# Patient Record
Sex: Female | Born: 1970 | Race: Black or African American | Hispanic: No | Marital: Single | Smoking: Never smoker
Health system: Southern US, Community
[De-identification: ages and names within clinical notes are randomized; demographics above are authoritative.]

## PROBLEM LIST (undated history)

## (undated) DIAGNOSIS — H269 Unspecified cataract: Secondary | ICD-10-CM

## (undated) DIAGNOSIS — I10 Essential (primary) hypertension: Secondary | ICD-10-CM

## (undated) DIAGNOSIS — K219 Gastro-esophageal reflux disease without esophagitis: Secondary | ICD-10-CM

## (undated) DIAGNOSIS — D649 Anemia, unspecified: Secondary | ICD-10-CM

## (undated) DIAGNOSIS — K589 Irritable bowel syndrome without diarrhea: Secondary | ICD-10-CM

## (undated) DIAGNOSIS — M1990 Unspecified osteoarthritis, unspecified site: Secondary | ICD-10-CM

## (undated) DIAGNOSIS — R011 Cardiac murmur, unspecified: Secondary | ICD-10-CM

## (undated) HISTORY — DX: Irritable bowel syndrome, unspecified: K58.9

## (undated) HISTORY — PX: WISDOM TOOTH EXTRACTION: SHX21

## (undated) HISTORY — DX: Unspecified osteoarthritis, unspecified site: M19.90

## (undated) HISTORY — DX: Unspecified cataract: H26.9

## (undated) HISTORY — PX: OTHER SURGICAL HISTORY: SHX169

## (undated) HISTORY — DX: Gastro-esophageal reflux disease without esophagitis: K21.9

---

## 2000-03-02 ENCOUNTER — Encounter (INDEPENDENT_AMBULATORY_CARE_PROVIDER_SITE_OTHER): Payer: Self-pay

## 2000-03-02 ENCOUNTER — Encounter: Payer: Self-pay | Admitting: Obstetrics

## 2000-03-02 ENCOUNTER — Inpatient Hospital Stay (HOSPITAL_COMMUNITY): Admission: AD | Admit: 2000-03-02 | Discharge: 2000-03-02 | Payer: Self-pay | Admitting: Obstetrics

## 2001-04-27 ENCOUNTER — Inpatient Hospital Stay (HOSPITAL_COMMUNITY): Admission: AD | Admit: 2001-04-27 | Discharge: 2001-04-27 | Payer: Self-pay | Admitting: Obstetrics

## 2001-05-04 ENCOUNTER — Encounter (HOSPITAL_COMMUNITY): Admission: RE | Admit: 2001-05-04 | Discharge: 2001-05-07 | Payer: Self-pay | Admitting: Obstetrics

## 2001-05-08 ENCOUNTER — Inpatient Hospital Stay (HOSPITAL_COMMUNITY): Admission: AD | Admit: 2001-05-08 | Discharge: 2001-05-10 | Payer: Self-pay | Admitting: Obstetrics

## 2001-06-05 HISTORY — PX: TUBAL LIGATION: SHX77

## 2001-06-21 ENCOUNTER — Ambulatory Visit (HOSPITAL_COMMUNITY): Admission: RE | Admit: 2001-06-21 | Discharge: 2001-06-21 | Payer: Self-pay | Admitting: Obstetrics

## 2002-07-04 ENCOUNTER — Emergency Department (HOSPITAL_COMMUNITY): Admission: AC | Admit: 2002-07-04 | Discharge: 2002-07-04 | Payer: Self-pay

## 2002-07-04 ENCOUNTER — Encounter: Payer: Self-pay | Admitting: Emergency Medicine

## 2004-02-26 ENCOUNTER — Other Ambulatory Visit: Admission: RE | Admit: 2004-02-26 | Discharge: 2004-02-26 | Payer: Self-pay | Admitting: Internal Medicine

## 2005-09-09 ENCOUNTER — Ambulatory Visit: Payer: Self-pay | Admitting: Internal Medicine

## 2006-07-25 ENCOUNTER — Ambulatory Visit: Payer: Self-pay | Admitting: Internal Medicine

## 2006-07-25 LAB — CONVERTED CEMR LAB
ALT: 16 units/L (ref 0–40)
AST: 24 units/L (ref 0–37)
Albumin: 3.7 g/dL (ref 3.5–5.2)
Alkaline Phosphatase: 77 units/L (ref 39–117)
BUN: 9 mg/dL (ref 6–23)
Basophils Absolute: 0 10*3/uL (ref 0.0–0.1)
Basophils Relative: 0.1 % (ref 0.0–1.0)
CO2: 26 meq/L (ref 19–32)
Calcium: 9 mg/dL (ref 8.4–10.5)
Chloride: 107 meq/L (ref 96–112)
Chol/HDL Ratio, serum: 2.5
Cholesterol: 131 mg/dL (ref 0–200)
Creatinine, Ser: 0.9 mg/dL (ref 0.4–1.2)
Eosinophil percent: 3.2 % (ref 0.0–5.0)
GFR calc non Af Amer: 77 mL/min
Glomerular Filtration Rate, Af Am: 93 mL/min/{1.73_m2}
Glucose, Bld: 86 mg/dL (ref 70–99)
HCT: 38.1 % (ref 36.0–46.0)
HDL: 52.3 mg/dL (ref >39.0)
Hemoglobin: 12.5 g/dL (ref 12.0–15.0)
LDL Cholesterol: 69 mg/dL (ref 0–99)
Lymphocytes Relative: 45.2 % (ref 12.0–46.0)
MCHC: 32.9 g/dL (ref 30.0–36.0)
MCV: 82.6 fL (ref 78.0–100.0)
Monocytes Absolute: 0.2 10*3/uL (ref 0.2–0.7)
Monocytes Relative: 7.7 % (ref 3.0–11.0)
Neutro Abs: 1.4 10*3/uL (ref 1.4–7.7)
Neutrophils Relative %: 43.8 % (ref 43.0–77.0)
Platelets: 199 10*3/uL (ref 150–400)
Potassium: 3.6 meq/L (ref 3.5–5.1)
RBC: 4.61 M/uL (ref 3.87–5.11)
RDW: 12.6 % (ref 11.5–14.6)
Sodium: 139 meq/L (ref 135–145)
TSH: 2.74 microintl units/mL (ref 0.35–5.50)
Total Bilirubin: 0.8 mg/dL (ref 0.3–1.2)
Total Protein: 7.1 g/dL (ref 6.0–8.3)
Triglyceride fasting, serum: 48 mg/dL (ref 0–149)
VLDL: 10 mg/dL (ref 0–40)
WBC: 3.1 10*3/uL — ABNORMAL LOW (ref 4.5–10.5)

## 2007-11-09 ENCOUNTER — Ambulatory Visit: Payer: Self-pay | Admitting: Internal Medicine

## 2007-11-09 DIAGNOSIS — N760 Acute vaginitis: Secondary | ICD-10-CM | POA: Insufficient documentation

## 2007-11-09 DIAGNOSIS — R519 Headache, unspecified: Secondary | ICD-10-CM | POA: Insufficient documentation

## 2007-11-09 DIAGNOSIS — R51 Headache: Secondary | ICD-10-CM | POA: Insufficient documentation

## 2007-11-09 DIAGNOSIS — I10 Essential (primary) hypertension: Secondary | ICD-10-CM | POA: Insufficient documentation

## 2007-11-09 LAB — CONVERTED CEMR LAB: Chlamydia, DNA Probe: NEGATIVE

## 2010-08-06 ENCOUNTER — Telehealth: Payer: Self-pay | Admitting: Internal Medicine

## 2010-09-03 ENCOUNTER — Ambulatory Visit: Payer: Self-pay | Admitting: Internal Medicine

## 2010-09-03 LAB — CONVERTED CEMR LAB
ALT: 16 units/L (ref 0–35)
AST: 26 units/L (ref 0–37)
Albumin: 3.5 g/dL (ref 3.5–5.2)
Alkaline Phosphatase: 67 units/L (ref 39–117)
BUN: 14 mg/dL (ref 6–23)
Basophils Absolute: 0 10*3/uL (ref 0.0–0.1)
Basophils Relative: 0.2 % (ref 0.0–3.0)
Bilirubin Urine: NEGATIVE
Bilirubin, Direct: 0 mg/dL (ref 0.0–0.3)
CO2: 26 meq/L (ref 19–32)
Calcium: 9.1 mg/dL (ref 8.4–10.5)
Chloride: 109 meq/L (ref 96–112)
Cholesterol: 136 mg/dL (ref 0–200)
Creatinine, Ser: 0.9 mg/dL (ref 0.4–1.2)
Eosinophils Absolute: 0.1 10*3/uL (ref 0.0–0.7)
Eosinophils Relative: 4.6 % (ref 0.0–5.0)
GFR calc non Af Amer: 96.4 mL/min (ref >60.00)
Glucose, Bld: 78 mg/dL (ref 70–99)
Glucose, Urine, Semiquant: NEGATIVE
HCT: 28.1 % — ABNORMAL LOW (ref 36.0–46.0)
HDL: 52.7 mg/dL (ref >39.00)
Hemoglobin: 8.9 g/dL — ABNORMAL LOW (ref 12.0–15.0)
Ketones, urine, test strip: NEGATIVE
LDL Cholesterol: 79 mg/dL (ref 0–99)
Lymphocytes Relative: 39.8 % (ref 12.0–46.0)
Lymphs Abs: 1.1 10*3/uL (ref 0.7–4.0)
MCHC: 31.5 g/dL (ref 30.0–36.0)
MCV: 67.2 fL — ABNORMAL LOW (ref 78.0–100.0)
Monocytes Absolute: 0.2 10*3/uL (ref 0.1–1.0)
Monocytes Relative: 6.4 % (ref 3.0–12.0)
Neutro Abs: 1.3 10*3/uL — ABNORMAL LOW (ref 1.4–7.7)
Neutrophils Relative %: 49 % (ref 43.0–77.0)
Nitrite: NEGATIVE
Platelets: 246 10*3/uL (ref 150.0–400.0)
Potassium: 4.8 meq/L (ref 3.5–5.1)
RBC: 4.19 M/uL (ref 3.87–5.11)
RDW: 20.4 % — ABNORMAL HIGH (ref 11.5–14.6)
Sodium: 139 meq/L (ref 135–145)
Specific Gravity, Urine: >=1.03
TSH: 3.66 microintl units/mL (ref 0.35–5.50)
Total Bilirubin: 0.5 mg/dL (ref 0.3–1.2)
Total CHOL/HDL Ratio: 3
Total Protein: 6.9 g/dL (ref 6.0–8.3)
Triglycerides: 23 mg/dL (ref 0.0–149.0)
Urobilinogen, UA: 0.2
VLDL: 4.6 mg/dL (ref 0.0–40.0)
WBC Urine, dipstick: NEGATIVE
WBC: 2.7 10*3/uL — ABNORMAL LOW (ref 4.5–10.5)
pH: 5

## 2010-09-17 ENCOUNTER — Ambulatory Visit: Admit: 2010-09-17 | Payer: Self-pay | Admitting: Internal Medicine

## 2010-09-26 ENCOUNTER — Encounter: Payer: Self-pay | Admitting: Obstetrics

## 2010-10-07 NOTE — Progress Notes (Signed)
Summary: Pt req refill of Norvasc 5mg  to [facility] [address]  Phone Note Refill Request Call back at [phone] Message from:  Patient on August 06, 2010 3:55 PM  Refills Requested: Medication #1:  NORVASC 5 MG  TABS once daily   Dosage confirmed as above?Dosage Confirmed Pls call in to [facility] on [address]    Method Requested: Telephone to Pharmacy Initial call taken by: Lucy Antigua,  August 06, 2010 3:55 PM    New/Updated Medications: NORVASC 5 MG  TABS (AMLODIPINE BESYLATE) once daily-no more with ov Prescriptions: NORVASC 5 MG  TABS (AMLODIPINE BESYLATE) once daily-no more with ov  #15 x 0   Entered by:   Willy Eddy, LPN   Authorized by:   Stacie Glaze MD   Signed by:   Willy Eddy, LPN on 56/43/3295   Method used:   Electronically to        [facility] [id number] (retail)       [address]       [place], [place]  [address]       Ph: [phone]       Fax: [phone]   RxID:   [id number]

## 2011-01-21 NOTE — Op Note (Signed)
[facility]  Patient:    Michele Gilmore, Michele Gilmore Visit Number: [id number] MRN: [id number]          Service Type: DSU Location: [facility] Attending Physician:  Venita Sheffield Dictated by:   Kathreen Cosier, M.D. Admit Date:  06/21/2001                             Operative Report  PREOPERATIVE DIAGNOSES:       Multiparity.  PROCEDURE:                    Open laparoscopic tubal.  DESCRIPTION OF PROCEDURE:     Under general anesthesia with the patient in a lithotomy position, abdomen, perineum, and vagina were prepped and draped. Bladder emptied with straight catheter.  Hulka tenaculum placed in the cervix. An umbilical transverse incision was made, carried down to the fascia.  Fascia cleaned and grasped with two Kochers.  Fascia and perineum opened with Mayo scissors.  Sleeve and trocar inserted intraperitoneally.  Carbon dioxide 3 L infused intraperitoneally.  Visualizing scope inserted through the sleeve of the trocar.  Uterus, tubes, and ovaries were normal.  Cautery prove was inserted through the sleeve and scope.  The right tube was grasped 1 inch from the cornua and the tube was traced to the fimbria.  Starting 1 inch from the cornua the tube was cauterized a total of five places moving lateral from the first site of cautery.  Procedure was done in exact fashion.  Lap and sponge counts correct.  Abdomen closed in layers.  Peritoneum continuous suture of 0 Chromic.  Skin closed with subcuticular stitch of 3-0 plain.  Patient tolerated procedure well, taken to recovery room in good condition. Dictated by:   Kathreen Cosier, M.D. Attending Physician:  Venita Sheffield DD:  06/21/01 TD:  06/21/01 Job: [id number]

## 2011-03-14 ENCOUNTER — Other Ambulatory Visit: Payer: Self-pay | Admitting: Obstetrics & Gynecology

## 2011-03-14 DIAGNOSIS — D219 Benign neoplasm of connective and other soft tissue, unspecified: Secondary | ICD-10-CM

## 2011-03-17 ENCOUNTER — Ambulatory Visit (HOSPITAL_COMMUNITY): Payer: Self-pay

## 2011-03-18 ENCOUNTER — Ambulatory Visit (HOSPITAL_COMMUNITY)
Admission: RE | Admit: 2011-03-18 | Discharge: 2011-03-18 | Disposition: A | Discharge status: Home / Self Care | Payer: 59 | Source: Ambulatory Visit | Attending: Obstetrics & Gynecology | Admitting: Obstetrics & Gynecology

## 2011-03-18 DIAGNOSIS — D259 Leiomyoma of uterus, unspecified: Secondary | ICD-10-CM | POA: Insufficient documentation

## 2011-03-18 DIAGNOSIS — D219 Benign neoplasm of connective and other soft tissue, unspecified: Secondary | ICD-10-CM

## 2011-09-06 HISTORY — PX: EYE SURGERY: SHX253

## 2011-10-12 ENCOUNTER — Other Ambulatory Visit: Payer: Self-pay | Admitting: Obstetrics

## 2011-10-15 ENCOUNTER — Encounter (HOSPITAL_COMMUNITY): Payer: Self-pay

## 2011-10-18 ENCOUNTER — Encounter (HOSPITAL_COMMUNITY): Payer: Self-pay

## 2011-10-18 ENCOUNTER — Encounter (HOSPITAL_COMMUNITY)
Admission: RE | Admit: 2011-10-18 | Discharge: 2011-10-18 | Disposition: A | Discharge status: Home / Self Care | Payer: 59 | Source: Ambulatory Visit | Attending: Obstetrics | Admitting: Obstetrics

## 2011-10-18 HISTORY — DX: Cardiac murmur, unspecified: R01.1

## 2011-10-18 HISTORY — DX: Essential (primary) hypertension: I10

## 2011-10-18 HISTORY — DX: Anemia, unspecified: D64.9

## 2011-10-18 LAB — SURGICAL PCR SCREEN
MRSA, PCR: NEGATIVE
Staphylococcus aureus: NEGATIVE

## 2011-10-18 LAB — BASIC METABOLIC PANEL
BUN: 10 mg/dL (ref 6–23)
Calcium: 9.2 mg/dL (ref 8.4–10.5)
Chloride: 104 mEq/L (ref 96–112)
Creatinine, Ser: 0.89 mg/dL (ref 0.50–1.10)
GFR calc Af Amer: >90 mL/min (ref >90)
GFR calc non Af Amer: 81 mL/min — ABNORMAL LOW (ref >90)

## 2011-10-18 LAB — CBC
HCT: 33.5 % — ABNORMAL LOW (ref 36.0–46.0)
MCHC: 31.6 g/dL (ref 30.0–36.0)
Platelets: 221 10*3/uL (ref 150–400)
RDW: 16.5 % — ABNORMAL HIGH (ref 11.5–15.5)
WBC: 2.7 10*3/uL — ABNORMAL LOW (ref 4.0–10.5)

## 2011-10-18 NOTE — Patient Instructions (Addendum)
   Your procedure is scheduled on: Friday, Feb 15th  Enter through the Main Entrance of [facility] at: 730am Pick up the phone at the desk and dial [phone] and inform us of your arrival.  Please call this number if you have any problems the morning of surgery: [phone]  Remember: Do not eat food after midnight: Thursday Do not drink clear liquids after: Thursday Take these medicines the morning of surgery with a SIP OF WATER: None.  (Patient takes BP med in the evenings.)  Do not wear jewelry, make-up, or FINGER nail polish Do not wear lotions, powders, perfumes or deodorant. Do not shave 48 hours prior to surgery. Do not bring valuables to the hospital.  Leave suitcase in the car. After Surgery it may be brought to your room. For patients being admitted to the hospital, checkout time is 11:00am the day of discharge.  Home with  Mother [name]  cell [phone]  Patients discharged on the day of surgery will not be allowed to drive home.     Remember to use your hibiclens as instructed.Please shower with 1/2 bottle the evening before your surgery and the other 1/2 bottle the morning of surgery.

## 2011-10-18 NOTE — Pre-Procedure Instructions (Signed)
Ok to see patient DOS. 

## 2011-10-20 MED ORDER — CEFAZOLIN SODIUM-DEXTROSE 2-3 GM-% IV SOLR
2.0000 g | INTRAVENOUS | Status: AC
  Administered 2011-10-21: 2 g via INTRAVENOUS
  Filled 2011-10-20: qty 50

## 2011-10-21 ENCOUNTER — Encounter (HOSPITAL_COMMUNITY)
Admission: RE | Disposition: A | Discharge status: Home / Self Care | Payer: Self-pay | Source: Ambulatory Visit | Attending: Obstetrics

## 2011-10-21 ENCOUNTER — Encounter (HOSPITAL_COMMUNITY): Payer: Self-pay | Admitting: Anesthesiology

## 2011-10-21 ENCOUNTER — Other Ambulatory Visit: Payer: Self-pay | Admitting: Obstetrics

## 2011-10-21 ENCOUNTER — Inpatient Hospital Stay (HOSPITAL_COMMUNITY): Payer: 59 | Admitting: Anesthesiology

## 2011-10-21 ENCOUNTER — Encounter (HOSPITAL_COMMUNITY): Payer: Self-pay | Admitting: *Deleted

## 2011-10-21 ENCOUNTER — Inpatient Hospital Stay (HOSPITAL_COMMUNITY)
Admission: RE | Admit: 2011-10-21 | Discharge: 2011-10-23 | DRG: 743 | Disposition: A | Discharge status: Home / Self Care | Payer: 59 | Source: Ambulatory Visit | Attending: Obstetrics | Admitting: Obstetrics

## 2011-10-21 DIAGNOSIS — N838 Other noninflammatory disorders of ovary, fallopian tube and broad ligament: Secondary | ICD-10-CM | POA: Diagnosis present

## 2011-10-21 DIAGNOSIS — N760 Acute vaginitis: Secondary | ICD-10-CM

## 2011-10-21 DIAGNOSIS — R51 Headache: Secondary | ICD-10-CM

## 2011-10-21 DIAGNOSIS — N949 Unspecified condition associated with female genital organs and menstrual cycle: Secondary | ICD-10-CM | POA: Diagnosis present

## 2011-10-21 DIAGNOSIS — Z90710 Acquired absence of both cervix and uterus: Secondary | ICD-10-CM

## 2011-10-21 DIAGNOSIS — I10 Essential (primary) hypertension: Secondary | ICD-10-CM

## 2011-10-21 DIAGNOSIS — N938 Other specified abnormal uterine and vaginal bleeding: Secondary | ICD-10-CM | POA: Diagnosis present

## 2011-10-21 DIAGNOSIS — D251 Intramural leiomyoma of uterus: Principal | ICD-10-CM | POA: Diagnosis present

## 2011-10-21 DIAGNOSIS — D252 Subserosal leiomyoma of uterus: Secondary | ICD-10-CM | POA: Diagnosis present

## 2011-10-21 HISTORY — PX: ABDOMINAL HYSTERECTOMY: SHX81

## 2011-10-21 LAB — BASIC METABOLIC PANEL
Calcium: 8.6 mg/dL (ref 8.4–10.5)
GFR calc non Af Amer: 79 mL/min — ABNORMAL LOW (ref >90)
Glucose, Bld: 101 mg/dL — ABNORMAL HIGH (ref 70–99)
Sodium: 138 mEq/L (ref 135–145)

## 2011-10-21 LAB — PREGNANCY, URINE: Preg Test, Ur: NEGATIVE

## 2011-10-21 SURGERY — HYSTERECTOMY, ABDOMINAL
Anesthesia: General | Site: Abdomen | Wound class: Clean Contaminated

## 2011-10-21 MED ORDER — DIPHENHYDRAMINE HCL 12.5 MG/5ML PO ELIX: 12.5000 mg | ORAL_SOLUTION | Freq: Four times a day (QID) | ORAL | Status: DC | PRN

## 2011-10-21 MED ORDER — KETOROLAC TROMETHAMINE 30 MG/ML IJ SOLN
INTRAMUSCULAR | Status: DC | PRN
  Administered 2011-10-21: 30 mg via INTRAVENOUS

## 2011-10-21 MED ORDER — GLYCOPYRROLATE 0.2 MG/ML IJ SOLN
INTRAMUSCULAR | Status: AC
  Filled 2011-10-21: qty 1

## 2011-10-21 MED ORDER — IBUPROFEN 800 MG PO TABS: 800.0000 mg | ORAL_TABLET | Freq: Three times a day (TID) | ORAL | Status: DC | PRN

## 2011-10-21 MED ORDER — KETOROLAC TROMETHAMINE 30 MG/ML IJ SOLN
INTRAMUSCULAR | Status: AC
  Filled 2011-10-21: qty 1

## 2011-10-21 MED ORDER — HYDROMORPHONE HCL PF 1 MG/ML IJ SOLN
0.2500 mg | INTRAMUSCULAR | Status: DC | PRN
  Administered 2011-10-21 (×2): 0.5 mg via INTRAVENOUS

## 2011-10-21 MED ORDER — SODIUM CHLORIDE 0.9 % IJ SOLN: 9.0000 mL | INTRAMUSCULAR | Status: DC | PRN

## 2011-10-21 MED ORDER — ONDANSETRON HCL 4 MG/2ML IJ SOLN: 4.0000 mg | Freq: Four times a day (QID) | INTRAMUSCULAR | Status: DC | PRN

## 2011-10-21 MED ORDER — FENTANYL CITRATE 0.05 MG/ML IJ SOLN
INTRAMUSCULAR | Status: DC | PRN
  Administered 2011-10-21: 100 ug via INTRAVENOUS
  Administered 2011-10-21: 150 ug via INTRAVENOUS

## 2011-10-21 MED ORDER — LACTATED RINGERS IV SOLN
INTRAVENOUS | Status: DC
  Administered 2011-10-21 (×4): via INTRAVENOUS

## 2011-10-21 MED ORDER — 0.9 % SODIUM CHLORIDE (POUR BTL) OPTIME
TOPICAL | Status: DC | PRN
  Administered 2011-10-21: 3000 mL

## 2011-10-21 MED ORDER — ONDANSETRON HCL 4 MG/2ML IJ SOLN
INTRAMUSCULAR | Status: AC
  Filled 2011-10-21: qty 2

## 2011-10-21 MED ORDER — HYDROMORPHONE 0.3 MG/ML IV SOLN
INTRAVENOUS | Status: AC
  Filled 2011-10-21: qty 25

## 2011-10-21 MED ORDER — DEXTROSE 5 % IV SOLN
1.0000 g | Freq: Four times a day (QID) | INTRAVENOUS | Status: DC
  Filled 2011-10-21 (×2): qty 1

## 2011-10-21 MED ORDER — DEXTROSE 5 % IV SOLN
2.0000 g | Freq: Four times a day (QID) | INTRAVENOUS | Status: AC
  Administered 2011-10-21 (×2): 2 g via INTRAVENOUS
  Filled 2011-10-21 (×2): qty 2

## 2011-10-21 MED ORDER — NEOSTIGMINE METHYLSULFATE 1 MG/ML IJ SOLN
INTRAMUSCULAR | Status: DC | PRN
  Administered 2011-10-21: 3 mg via INTRAVENOUS

## 2011-10-21 MED ORDER — NALOXONE HCL 0.4 MG/ML IJ SOLN: 0.4000 mg | INTRAMUSCULAR | Status: DC | PRN

## 2011-10-21 MED ORDER — ROCURONIUM BROMIDE 50 MG/5ML IV SOLN
INTRAVENOUS | Status: AC
  Filled 2011-10-21: qty 1

## 2011-10-21 MED ORDER — OXYCODONE-ACETAMINOPHEN 5-325 MG PO TABS
1.0000 | ORAL_TABLET | ORAL | Status: DC | PRN
  Administered 2011-10-22 – 2011-10-23 (×4): 2 via ORAL
  Filled 2011-10-21 (×4): qty 2

## 2011-10-21 MED ORDER — DEXAMETHASONE SODIUM PHOSPHATE 10 MG/ML IJ SOLN
INTRAMUSCULAR | Status: AC
  Filled 2011-10-21: qty 1

## 2011-10-21 MED ORDER — NEOSTIGMINE METHYLSULFATE 1 MG/ML IJ SOLN
INTRAMUSCULAR | Status: AC
  Filled 2011-10-21: qty 10

## 2011-10-21 MED ORDER — MIDAZOLAM HCL 5 MG/5ML IJ SOLN
INTRAMUSCULAR | Status: DC | PRN
  Administered 2011-10-21: 2 mg via INTRAVENOUS

## 2011-10-21 MED ORDER — DIPHENHYDRAMINE HCL 50 MG/ML IJ SOLN: 12.5000 mg | Freq: Four times a day (QID) | INTRAMUSCULAR | Status: DC | PRN

## 2011-10-21 MED ORDER — PROPOFOL 10 MG/ML IV EMUL
INTRAVENOUS | Status: AC
  Filled 2011-10-21: qty 20

## 2011-10-21 MED ORDER — FENTANYL CITRATE 0.05 MG/ML IJ SOLN
INTRAMUSCULAR | Status: AC
  Filled 2011-10-21: qty 5

## 2011-10-21 MED ORDER — KETOROLAC TROMETHAMINE 30 MG/ML IJ SOLN: 30.0000 mg | Freq: Four times a day (QID) | INTRAMUSCULAR | Status: DC

## 2011-10-21 MED ORDER — SODIUM CHLORIDE 0.9 % IJ SOLN
9.0000 mL | INTRAMUSCULAR | Status: DC | PRN
Start: 2011-10-21 — End: 2011-10-22

## 2011-10-21 MED ORDER — ONDANSETRON HCL 4 MG/2ML IJ SOLN
4.0000 mg | Freq: Four times a day (QID) | INTRAMUSCULAR | Status: DC | PRN
  Administered 2011-10-22: 4 mg via INTRAVENOUS
  Filled 2011-10-21: qty 2

## 2011-10-21 MED ORDER — LACTATED RINGERS IV SOLN: INTRAVENOUS | Status: DC

## 2011-10-21 MED ORDER — DEXAMETHASONE SODIUM PHOSPHATE 4 MG/ML IJ SOLN
INTRAMUSCULAR | Status: DC | PRN
  Administered 2011-10-21: 10 mg via INTRAVENOUS

## 2011-10-21 MED ORDER — GLYCOPYRROLATE 0.2 MG/ML IJ SOLN
INTRAMUSCULAR | Status: DC | PRN
  Administered 2011-10-21: .6 mg via INTRAVENOUS

## 2011-10-21 MED ORDER — PROPOFOL 10 MG/ML IV BOLUS
INTRAVENOUS | Status: DC | PRN
  Administered 2011-10-21: 150 mg via INTRAVENOUS
  Administered 2011-10-21: 20 mg via INTRAVENOUS

## 2011-10-21 MED ORDER — ONDANSETRON HCL 4 MG PO TABS: 4.0000 mg | ORAL_TABLET | Freq: Four times a day (QID) | ORAL | Status: DC | PRN

## 2011-10-21 MED ORDER — HYDROMORPHONE 0.3 MG/ML IV SOLN: INTRAVENOUS | Status: DC

## 2011-10-21 MED ORDER — MEPERIDINE HCL 25 MG/ML IJ SOLN: 6.2500 mg | INTRAMUSCULAR | Status: DC | PRN

## 2011-10-21 MED ORDER — LIDOCAINE HCL (CARDIAC) 20 MG/ML IV SOLN
INTRAVENOUS | Status: AC
  Filled 2011-10-21: qty 5

## 2011-10-21 MED ORDER — HYDROMORPHONE 0.3 MG/ML IV SOLN
INTRAVENOUS | Status: DC
  Administered 2011-10-21: 13:00:00 via INTRAVENOUS

## 2011-10-21 MED ORDER — METOCLOPRAMIDE HCL 5 MG/ML IJ SOLN: 10.0000 mg | Freq: Once | INTRAMUSCULAR | Status: AC | PRN

## 2011-10-21 MED ORDER — LACTATED RINGERS IV SOLN
INTRAVENOUS | Status: DC
  Administered 2011-10-21 – 2011-10-22 (×2): via INTRAVENOUS

## 2011-10-21 MED ORDER — ONDANSETRON HCL 4 MG/2ML IJ SOLN
INTRAMUSCULAR | Status: DC | PRN
  Administered 2011-10-21: 4 mg via INTRAVENOUS

## 2011-10-21 MED ORDER — OXYCODONE-ACETAMINOPHEN 5-325 MG PO TABS: 1.0000 | ORAL_TABLET | ORAL | Status: DC | PRN

## 2011-10-21 MED ORDER — KETOROLAC TROMETHAMINE 30 MG/ML IJ SOLN
30.0000 mg | Freq: Four times a day (QID) | INTRAMUSCULAR | Status: AC
  Administered 2011-10-21 – 2011-10-22 (×3): 30 mg via INTRAVENOUS
  Filled 2011-10-21 (×3): qty 1

## 2011-10-21 MED ORDER — HYDROMORPHONE 0.3 MG/ML IV SOLN
INTRAVENOUS | Status: DC
  Administered 2011-10-21: 5.7 mg via INTRAVENOUS
  Administered 2011-10-21: 5 mL via INTRAVENOUS
  Administered 2011-10-21: 23:00:00 via INTRAVENOUS
  Administered 2011-10-22: 1.67 mg via INTRAVENOUS
  Administered 2011-10-22: 1.3 mg via INTRAVENOUS
  Administered 2011-10-22: 1.2 mg via INTRAVENOUS
  Administered 2011-10-22: 0.9 mg via INTRAVENOUS

## 2011-10-21 MED ORDER — FENTANYL CITRATE 0.05 MG/ML IJ SOLN
25.0000 ug | INTRAMUSCULAR | Status: DC | PRN
  Administered 2011-10-21 (×2): 50 ug via INTRAVENOUS

## 2011-10-21 MED ORDER — HYDROMORPHONE HCL PF 1 MG/ML IJ SOLN
INTRAMUSCULAR | Status: AC
  Administered 2011-10-21: 0.5 mg via INTRAVENOUS
  Filled 2011-10-21: qty 1

## 2011-10-21 MED ORDER — KETOROLAC TROMETHAMINE 30 MG/ML IJ SOLN: 30.0000 mg | Freq: Four times a day (QID) | INTRAMUSCULAR | Status: AC

## 2011-10-21 MED ORDER — HYDROMORPHONE HCL PF 1 MG/ML IJ SOLN
INTRAMUSCULAR | Status: AC
  Filled 2011-10-21: qty 1

## 2011-10-21 MED ORDER — FENTANYL CITRATE 0.05 MG/ML IJ SOLN
INTRAMUSCULAR | Status: AC
  Administered 2011-10-21: 50 ug via INTRAVENOUS
  Filled 2011-10-21: qty 2

## 2011-10-21 MED ORDER — IBUPROFEN 800 MG PO TABS
800.0000 mg | ORAL_TABLET | Freq: Three times a day (TID) | ORAL | Status: DC | PRN
  Administered 2011-10-22 – 2011-10-23 (×2): 800 mg via ORAL
  Filled 2011-10-21 (×2): qty 1

## 2011-10-21 MED ORDER — ROCURONIUM BROMIDE 100 MG/10ML IV SOLN
INTRAVENOUS | Status: DC | PRN
  Administered 2011-10-21: 5 mg via INTRAVENOUS
  Administered 2011-10-21: 45 mg via INTRAVENOUS
  Administered 2011-10-21: 10 mg via INTRAVENOUS

## 2011-10-21 MED ORDER — MIDAZOLAM HCL 2 MG/2ML IJ SOLN
INTRAMUSCULAR | Status: AC
  Filled 2011-10-21: qty 2

## 2011-10-21 SURGICAL SUPPLY — 40 items
BLADE EXTENDED COATED 6.5IN (ELECTRODE) ×3 IMPLANT
CANISTER SUCTION 2500CC (MISCELLANEOUS) ×3 IMPLANT
CHLORAPREP W/TINT 26ML (MISCELLANEOUS) ×3 IMPLANT
CLOTH BEACON ORANGE TIMEOUT ST (SAFETY) ×3 IMPLANT
CONT PATH 16OZ SNAP LID 3702 (MISCELLANEOUS) ×3 IMPLANT
DRSG COVADERM 4X6 (GAUZE/BANDAGES/DRESSINGS) ×3 IMPLANT
GAUZE SPONGE 4X4 16PLY XRAY LF (GAUZE/BANDAGES/DRESSINGS) IMPLANT
GLOVE BIO SURGEON STRL SZ 6.5 (GLOVE) ×3 IMPLANT
GLOVE BIO SURGEON STRL SZ8 (GLOVE) ×6 IMPLANT
GLOVE BIOGEL M STER SZ 6 (GLOVE) ×3 IMPLANT
GLOVE BIOGEL PI IND STRL 6.5 (GLOVE) ×2 IMPLANT
GLOVE BIOGEL PI IND STRL 7.0 (GLOVE) ×2 IMPLANT
GLOVE BIOGEL PI IND STRL 7.5 (GLOVE) ×2 IMPLANT
GLOVE BIOGEL PI INDICATOR 6.5 (GLOVE) ×1
GLOVE BIOGEL PI INDICATOR 7.0 (GLOVE) ×1
GLOVE BIOGEL PI INDICATOR 7.5 (GLOVE) ×1
GLOVE ECLIPSE 6.0 STRL STRAW (GLOVE) ×3 IMPLANT
GOWN PREVENTION PLUS LG XLONG (DISPOSABLE) ×9 IMPLANT
GOWN PREVENTION PLUS XLARGE (GOWN DISPOSABLE) ×3 IMPLANT
NS IRRIG 1000ML POUR BTL (IV SOLUTION) ×9 IMPLANT
PACK ABDOMINAL GYN (CUSTOM PROCEDURE TRAY) ×3 IMPLANT
PAD OB MATERNITY 4.3X12.25 (PERSONAL CARE ITEMS) ×3 IMPLANT
PROTECTOR NERVE ULNAR (MISCELLANEOUS) ×6 IMPLANT
SEPRAFILM MEMBRANE 5X6 (MISCELLANEOUS) IMPLANT
SPONGE LAP 18X18 X RAY DECT (DISPOSABLE) ×3 IMPLANT
STAPLER VISISTAT 35W (STAPLE) ×3 IMPLANT
SUT MNCRL AB 4-0 PS2 18 (SUTURE) ×3 IMPLANT
SUT MON AB 2-0 CT1 36 (SUTURE) ×3 IMPLANT
SUT MON AB 3-0 SH 27 (SUTURE)
SUT MON AB 3-0 SH27 (SUTURE) IMPLANT
SUT VIC AB 0 CT1 27 (SUTURE) ×3
SUT VIC AB 0 CT1 27XBRD ANBCTR (SUTURE) ×6 IMPLANT
SUT VIC AB 0 CT1 36 (SUTURE) ×27 IMPLANT
SUT VIC AB 0 CTXB 36 (SUTURE) ×6 IMPLANT
SUT VIC AB 3-0 SH 27 (SUTURE) ×1
SUT VIC AB 3-0 SH 27X BRD (SUTURE) ×2 IMPLANT
SUT VICRYL 0 TIES 12 18 (SUTURE) ×3 IMPLANT
TOWEL OR 17X24 6PK STRL BLUE (TOWEL DISPOSABLE) ×9 IMPLANT
TRAY FOLEY CATH 14FR (SET/KITS/TRAYS/PACK) ×3 IMPLANT
WATER STERILE IRR 1000ML POUR (IV SOLUTION) ×3 IMPLANT

## 2011-10-21 NOTE — Anesthesia Preprocedure Evaluation (Addendum)
Anesthesia Evaluation  Patient identified by MRN, date of birth, ID band Patient awake    Reviewed: Allergy & Precautions, H&P , NPO status , Patient's Chart, lab work & pertinent test results  Airway Mallampati: II TM Distance: >3 FB Neck ROM: Full    Dental No notable dental hx. (+) Teeth Intact and Partial Lower   Pulmonary  clear to auscultation  Pulmonary exam normal       Cardiovascular hypertension, Pt. on medications + Valvular Problems/Murmurs Regular Normal+ Systolic murmurs    Neuro/Psych  Headaches,    GI/Hepatic negative GI ROS, Neg liver ROS,   Endo/Other  Negative Endocrine ROS  Renal/GU negative Renal ROS  Genitourinary negative   Musculoskeletal negative musculoskeletal ROS (+)   Abdominal (+)  Abdomen: soft.    Peds  Hematology negative hematology ROS (+)   Anesthesia Other Findings   Reproductive/Obstetrics negative OB ROS                          Anesthesia Physical Anesthesia Plan  ASA: II  Anesthesia Plan: General   Post-op Pain Management:    Induction: Intravenous  Airway Management Planned: Oral ETT  Additional Equipment:   Intra-op Plan:   Post-operative Plan:   Informed Consent: I have reviewed the patients History and Physical, chart, labs and discussed the procedure including the risks, benefits and alternatives for the proposed anesthesia with the patient or authorized representative who has indicated his/her understanding and acceptance.   Dental advisory given  Plan Discussed with: CRNA, Anesthesiologist and Surgeon  Anesthesia Plan Comments:         Anesthesia Quick Evaluation

## 2011-10-21 NOTE — OR Nursing (Signed)
Surgical Procedure performed, Total abdominal hysterectomy, Bilateral Salpingectomy, Right oophorectomy

## 2011-10-21 NOTE — Op Note (Signed)
Hysterectomy Procedure Note  Indications: 40yo with heavy, painful periods.  Large fibroid uterus.  Pre-operative Diagnosis: Symptomatic Uterine Fibroids  Post-operative Diagnosis: same  Operation: Total abdominal hysterectomy, Right oophorectomy, Bilateral salpingectomy.  Surgeon: Brock Bad   Assistants: Liam Rogers  Anesthesia: General endotracheal anesthesia  ASA Class: 2  Procedure Details  The patient was seen in the Holding Room. The risks, benefits, complications, treatment options, and expected outcomes were discussed with the patient.  The patient concurred with the proposed plan, giving informed consent.  The site of surgery properly noted/marked. The patient was taken to Operating Room # 4, identified as Shilah M Fogarty and the procedure verified as Total abdominal hysterectomy, right salpingo-oophorectomy, left salpingectomy. A Time Out was held and the above information confirmed.  After induction of anesthesia, the patient was draped and prepped in the usual sterile manner. Pt was placed in supine position after anesthesia and draped and prepped in the usual sterile manner. Foley catheter was placed.  A pfantenstial incision was made and carried through the subcutaneous tissue to the fascia. Fascial incision was made and extended left and right. The rectus muscles were separated. The peritoneum was identified and entered. Peritoneal incision was extended longitudinally.  The above findings were noted. Deever retractor was placed and bowel was packed away from the surgical site.   The round ligaments were identified, cut, and ligated with 0-Vicryl. The anterior peritoneal reflection was incised and the bladder was dissected off the lower uterine segment. The retroperitoneal space was explored and the ureters were identified bilaterally. The right infundibulo-pelvic ligament was grasped, cut, and suture ligated with 0-Vicryl. The left utero-ovarian ligament and  proximal fallopian tube were grasped, cut and suture ligated with 0-Vicryl. Hemostasis  was observed. The uterine vessels were skeletonized, then clamped, cut and suture ligated with 0-Vicryl suture. Serial pedicles of the cardinal and utero-sacral ligaments were clamped, cut, and suture ligated with 0-Vicryl. Entrance was made into the vagina and the uterus removed. Vaginal cuff angle sutures were placed incorporating the utero-sacral ligaments for support. The vaginal cuff was then closed with a running stitch of 0- Vicryl. Lavage was carried out until clear. Hemostasis was observed.  Retractor and all packing was removed from the abdomen. The fascia was approximated with running sutures of 0-Vicryl. Lavage was again carried out. Hemostasis was observed. The skin was approximated with staples.  Instrument, sponge, and needle counts were correct prior to abdominal closure and at the conclusion of the case.   Findings: 812 gram fibroid uterus  Estimated Blood Loss:  300 mL         Drains: foley to gravity         Total IV Fluids:         Specimens: uterus, right ovary, fallopian tubes         Implants: none         Complications:  None; patient tolerated the procedure well.         Disposition: PACU - hemodynamically stable.         Condition: stable  Attending Attestation: I was present and scrubbed for the entire procedure.

## 2011-10-21 NOTE — Anesthesia Procedure Notes (Signed)
Procedure Name: Intubation Date/Time: 10/21/2011 9:14 AM Performed by: Isabella Bowens Pre-anesthesia Checklist: Patient identified, Emergency Drugs available, Suction available, Patient being monitored and Timeout performed Oxygen Delivery Method: Circle System Utilized Preoxygenation: Pre-oxygenation with 100% oxygen Intubation Type: IV induction Ventilation: Mask ventilation without difficulty Laryngoscope Size: Mac and 3 Grade View: Grade I Tube type: Oral Tube size: 7.0 mm Number of attempts: 1 Airway Equipment and Method: stylet Placement Confirmation: ETT inserted through vocal cords under direct vision,  positive ETCO2 and breath sounds checked- equal and bilateral Secured at: 21 cm Tube secured with: Tape Dental Injury: Teeth and Oropharynx as per pre-operative assessment  Difficulty Due To: Difficulty was unanticipated

## 2011-10-21 NOTE — Anesthesia Postprocedure Evaluation (Signed)
  Anesthesia Post-op Note  Patient: Michele Gilmore  Procedure(s) Performed: Procedure(s) (LRB): HYSTERECTOMY ABDOMINAL (N/A) BILATERAL SALPINGECTOMY (Bilateral)  Patient Location: PACU and Women's Unit  Anesthesia Type: General  Level of Consciousness: awake, alert , oriented and patient cooperative  Airway and Oxygen Therapy: Patient connected to nasal cannula oxygen  Post-op Pain: mild  Post-op Assessment: Post-op Vital signs reviewed  Post-op Vital Signs: Reviewed and stable  Complications: No apparent anesthesia complications

## 2011-10-21 NOTE — Addendum Note (Signed)
Addendum  created 10/21/11 2027 by Rosalia Hammers, CRNA   Modules edited:Notes Section

## 2011-10-21 NOTE — Transfer of Care (Signed)
Immediate Anesthesia Transfer of Care Note  Patient: Michele Gilmore  Procedure(s) Performed: Procedure(s) (LRB): HYSTERECTOMY ABDOMINAL (N/A) BILATERAL SALPINGECTOMY (Bilateral)  Patient Location: PACU  Anesthesia Type: General  Level of Consciousness: awake, alert  and oriented  Airway & Oxygen Therapy: Patient Spontanous Breathing and Patient connected to nasal cannula oxygen  Post-op Assessment: Report given to PACU RN and Post -op Vital signs reviewed and stable  Post vital signs: Reviewed and stable  Complications: No apparent anesthesia complications

## 2011-10-21 NOTE — Anesthesia Postprocedure Evaluation (Signed)
  Anesthesia Post-op Note  Patient: Michele Gilmore  Procedure(s) Performed: Procedure(s) (LRB): HYSTERECTOMY ABDOMINAL (N/A) BILATERAL SALPINGECTOMY (Bilateral)  Patient Location: PACU  Anesthesia Type: General  Level of Consciousness: awake, alert  and oriented  Airway and Oxygen Therapy: Patient Spontanous Breathing  Post-op Pain: mild  Post-op Assessment: Post-op Vital signs reviewed, Patient's Cardiovascular Status Stable, Respiratory Function Stable, Patent Airway, No signs of Nausea or vomiting and Pain level controlled  Post-op Vital Signs: Reviewed and stable  Complications: No apparent anesthesia complications

## 2011-10-21 NOTE — H&P (Signed)
Michele Gilmore is an 41 y.o. female. Presents for TAH for symptomatic uterine fibroids.  Pertinent Gynecological History: Menses: flow is moderate Bleeding: dysfunctional uterine bleeding Contraception: tubal ligation DES exposure: denies Blood transfusions: none Sexually transmitted diseases: no past history Previous GYN Procedures: none  Last pap: normal Date: 2012 OB History: G3, P3   Menstrual History: Menarche age: 32 No LMP recorded.    Past Medical History  Diagnosis Date  . Fibroids   . Hypertension   . Heart murmur     as child, no problem  . Anemia     Past Surgical History  Procedure Date  . Tubal ligation 06/2001  . Wisdom tooth extraction   . Svd     x 3    No family history on file.  Social History:  reports that she has never smoked. She has never used smokeless tobacco. She reports that she drinks alcohol. She reports that she uses illicit drugs.  Allergies: No Known Allergies  Prescriptions prior to admission  Medication Sig Dispense Refill  . Fe Fum-FePoly-FA-Vit C-Vit B3 (INTEGRA F) 125-1 MG CAPS Take 1 tablet by mouth daily.      Marland Kitchen HYDROcodone-acetaminophen (VICODIN ES) 7.5-750 MG per tablet Take 1 tablet by mouth every 6 (six) hours as needed. For pain      . lisinopril-hydrochlorothiazide (PRINZIDE,ZESTORETIC) 20-25 MG per tablet Take 1 tablet by mouth every other day.      Marland Kitchen OVER THE COUNTER MEDICATION Take 1 tablet by mouth daily. Raspberry ketone product from [facility]      . ibuprofen (ADVIL,MOTRIN) 800 MG tablet Take 800 mg by mouth every 8 (eight) hours as needed.        Review of Systems  All other systems reviewed and are negative.    Blood pressure 124/83, pulse 75, temperature 97.7 F (36.5 C), temperature source Oral, resp. rate 18, SpO2 100.00%. Physical Exam  Nursing note and vitals reviewed. Constitutional: She is oriented to person, place, and time.  HENT:  Head: Normocephalic and atraumatic.  Neck: Normal range of  motion. Neck supple.  Cardiovascular: Normal rate and regular rhythm.   Respiratory: Effort normal and breath sounds normal.  GI: Soft. Bowel sounds are normal.  Genitourinary: Vagina normal.  Musculoskeletal: Normal range of motion.  Neurological: She is alert and oriented to person, place, and time. She has normal reflexes.  Skin: Skin is warm and dry.  Psychiatric: She has a normal mood and affect. Her behavior is normal. Judgment and thought content normal.  Uterus 16 weeks size. No results found for this or any previous visit (from the past 24 hour(s)).  No results found.  Assessment/Plan: Symptomatic uterine fibroids.  TAH.  [name] A 10/21/2011, 8:16 AM

## 2011-10-21 NOTE — Progress Notes (Signed)
UR Chart review completed.  

## 2011-10-22 ENCOUNTER — Encounter (HOSPITAL_COMMUNITY): Payer: Self-pay

## 2011-10-22 LAB — CBC
HCT: 28 % — ABNORMAL LOW (ref 36.0–46.0)
HCT: 28 % — ABNORMAL LOW (ref 36.0–46.0)
Hemoglobin: 8.9 g/dL — ABNORMAL LOW (ref 12.0–15.0)
Hemoglobin: 8.8 g/dL — ABNORMAL LOW (ref 12.0–15.0)
MCH: 24.4 pg — ABNORMAL LOW (ref 26.0–34.0)
MCHC: 31.4 g/dL (ref 30.0–36.0)
MCV: 77.6 fL — ABNORMAL LOW (ref 78.0–100.0)
MCV: 77.8 fL — ABNORMAL LOW (ref 78.0–100.0)
RBC: 3.61 MIL/uL — ABNORMAL LOW (ref 3.87–5.11)
WBC: 7.4 10*3/uL (ref 4.0–10.5)

## 2011-10-22 LAB — COMPREHENSIVE METABOLIC PANEL
Albumin: 2.9 g/dL — ABNORMAL LOW (ref 3.5–5.2)
Alkaline Phosphatase: 49 U/L (ref 39–117)
BUN: 12 mg/dL (ref 6–23)
Creatinine, Ser: 0.79 mg/dL (ref 0.50–1.10)
Potassium: 3.6 mEq/L (ref 3.5–5.1)
Total Protein: 5.9 g/dL — ABNORMAL LOW (ref 6.0–8.3)

## 2011-10-22 NOTE — Anesthesia Postprocedure Evaluation (Signed)
  Anesthesia Post-op Note  Patient: Michele Gilmore  Procedure(s) Performed: Procedure(s) (LRB): HYSTERECTOMY ABDOMINAL (N/A) BILATERAL SALPINGECTOMY (Bilateral)  Patient Location: PACU and Women's Unit  Anesthesia Type: General  Level of Consciousness: awake, alert , oriented and sedated  Airway and Oxygen Therapy: Patient Spontanous Breathing and Patient connected to nasal cannula oxygen  Post-op Pain: mild  Post-op Assessment: Post-op Vital signs reviewed  Post-op Vital Signs: Reviewed and stable  Complications: No apparent anesthesia complications

## 2011-10-22 NOTE — Progress Notes (Signed)
1 Day Post-Op Procedure(s) (LRB): HYSTERECTOMY ABDOMINAL (N/A)  BILATERAL SALPINGECTOMY (Bilateral)  Right oophorectomy.  Subjective: Patient reports tolerating PO.    Objective: I have reviewed patient's vital signs, intake and output, medications and labs.  General: alert and no distress Resp: clear to auscultation bilaterally Cardio: regular rate and rhythm, S1, S2 normal, no murmur, click, rub or gallop GI: soft, non-tender; bowel sounds normal; no masses,  no organomegaly and incision: dry and nontender Extremities: extremities normal, atraumatic, no cyanosis or edema Vaginal Bleeding: none Hemoglobin: 8.9 ,   BMET: WNL's      Assessment: s/p Procedure(s) (LRB): HYSTERECTOMY ABDOMINAL (N/A)  Right oophorectomy. BILATERAL SALPINGECTOMY (Bilateral): stable and tolerating diet  Plan: Advance diet  LOS: 1 day    Michele Gilmore A 10/22/2011, 6:51 AM

## 2011-10-23 MED ORDER — TINIDAZOLE 500 MG PO TABS: 1000.0000 mg | ORAL_TABLET | Freq: Every day | ORAL | Status: DC

## 2011-10-23 MED ORDER — OXYCODONE-ACETAMINOPHEN 5-325 MG PO TABS: 1.0000 | ORAL_TABLET | ORAL | Status: DC | PRN

## 2011-10-23 NOTE — Discharge Instructions (Addendum)
Hysterectomy Care After These instructions give you information on caring for yourself after your procedure. Your doctor may also give you more specific instructions. Call your doctor if you have any problems or questions after your procedure. HOME CARE  Only take medicine as told by your doctor. Do not take aspirin.   Do not drive when taking pain medicine.   Resume normal diet and activities as told by your doctor.   Get plenty of rest and sleep.   Do not douche, use tampons, or have sex (intercourse) until approved by your doctor.   Change your bandages (dressings) as told by your doctor.   Take your temperature 2 times a day. Write it down.   Take showers instead of baths for a few weeks or as told by your doctor.   Do not drink alcohol until approved by your doctor.   Take a medicine to help you go poop (laxative) as told by your doctor. Try eating bran foods and drinking fluids to help with this problem.   Have someone help you at home for 1 to 2 weeks after your surgery.   Make sure you and your family understand your surgery and recovery.   Do not sign important papers until you feel normal again.   Keep follow-up visits as told by your doctor.  GET HELP RIGHT AWAY IF:  You have a fever 100.4 or greater.Hysterectomy A hysterectomy is a procedure where your womb (uterus) is surgically taken out. It will no longer be possible to have menstrual periods or to become pregnant. Removal of the tubes and ovaries (bilateral salpingo-oopherectomy) can be done during this operation as well.  An abdominal hysterectomy is done through a large cut (incision) in the abdomen made by the surgeon.  A vaginal hysterectomy is done through the vagina. There are no abdominal incisions, but there will be incisions on the inside of the vagina.  A laparoscopic assisted vaginal hysterectomy is done through 2 or 3 small incisions in the abdomen, but the uterus is removed and passed through the  vagina.  Women who are going to have a hysterectomy should be tested first to make sure there is no cancer of the cervix or in the uterus. INDICATIONS FOR HYSTERECTOMY: Persistent abnormal bleeding.  Lasting (chronic) pelvic pain.  Endometriosis. This is when the lining of the uterus (endometrium) is misplaced outside of its normal location.  Adenomyosis. This is when the endometrium tissue grows in the muscle of the uterus.  Uterine prolapse. This is when the uterus falls down into the vagina.  Cancer of the uterus or cervix that requires a radical hysterectomy, removal of the uterus, tubes, ovaries, and surrounding lymph nodes.  LET YOUR CAREGIVER KNOW ABOUT: Allergies (especially to medicines).  Medications taken including herbs, eye drops, over the counter medications, and creams.  Use of steroids (by mouth or creams).  Past problems with anesthetics or numbing medication.  Possibility of pregnancy, if this applies.  History of blood clots (thrombophlebitis).  History of bleeding or blood problems.  Past surgery.  Other health problems.  RISKS AND COMPLICATIONS All surgeries can have risks. Some of these risks are: A lot of bleeding.  Injury to surrounding organs.  Infection.  Blood clots of the leg, heart, or lung.  Problems with anesthesia.  Early menopause.  BEFORE THE PROCEDURE Do not take aspirin or blood thinners for a week before surgery, or as directed by your caregiver.  Do not eat or drink anything after midnight the  night before surgery, or as directed by your caregiver.  Let your caregiver know if you get a cold or other infectious problems before surgery.  If you are being admitted the day of surgery, you should be present 60 minutes before your procedure or as told by your caregiver.  PROCEDURE  An IV (intravenous) will be placed in your arm. You will be given a drug to make you sleep (anesthetic) during surgery. You may be given a shot in the spine (spinal  anesthesia) that will numb your body from the waist down. This will keep you pain-free during surgery.  When you wake from surgery, you will have the IV and a long, narrow, hollow tube (urinary catheter) draining the bladder for 1 or 2 days after surgery. This will make passing your urine easier. It also helps by keeping your bladder empty during surgery.  After surgery, you will be taken to the recovery area where a nurse will watch and check your progress. Once you wake up, stable and taking fluids well, without other problems, you will be allowed to return to your room. Usually you will remain in the hospital 3 to 5 days. You may be given an antibiotic during and after the surgery and when you go home. Pain medication will be ordered by your caregiver while you are in the hospital and when you go home.  HOME CARE INSTRUCTIONS  Healing will take time. You will have discomfort, tenderness, swelling, and bruising at the operative site for a couple of weeks. This is normal and will get better as time goes on.  Only take over-the-counter or prescription medicines for pain, discomfort, or fever as directed by your caregiver.  Do not take aspirin. It can cause bleeding.  Do not drive when taking pain medication.  Follow your caregiver's advice regarding diet, exercise, lifting, driving, and general activities.  Resume your usual diet as directed and allowed.  Get plenty of rest and sleep.  Do not douche, use tampons, or have sexual intercourse until your caregiver gives you permission.  Change your bandages (dressings) as directed.  Take your temperature twice a day. Write it down.  Your caregiver may recommend showers instead of baths for a few weeks.  Do not drink alcohol until your caregiver gives you permission.  If you develop constipation, you may take a mild laxative with your caregiver's permission. Bran foods and drinking fluids helps with constipation problems.  Try to have someone home with  you for a week or two to help with the household activities.  Make sure you and your family understands everything about your operation and recovery.  Do not sign any legal documents until you feel normal again.  Keep all your follow-up appointments as recommended by your caregiver.  SEEK MEDICAL CARE IF:  There is swelling, redness, or increasing pain in the wound area.  Pus is coming from the wound.  You notice a bad smell from the wound or surgical dressing.  You have pain, redness, and swelling from the intravenous site.  The wound is breaking open (the edges are not staying together).  You feel dizzy or feel like fainting.  You develop pain or bleeding when you urinate.  You develop diarrhea.  You develop nausea and vomiting.  You develop abnormal vaginal discharge.  You develop a rash.  You have any type of abnormal reaction or develop an allergy to your medication.  You need stronger pain medication for your pain.  SEEK IMMEDIATE MEDICAL CARE  IF: You have a fever.  You develop abdominal pain.  You develop chest pain.  You develop shortness of breath.  You pass out.  You develop pain, swelling, or redness of your leg.  You develop heavy vaginal bleeding with or without blood clots.  Document Released: 02/15/2001 Document Revised: 05/04/2011 Document Reviewed: 01/03/2008  [facility] Patient Information 2012 [place], [place].  You have belly (abdominal) pain.   You have chest pain.   You have shortness of breath.   You pass out (faint).   You have puffiness (swelling), redness, or pain of your leg.   You have puffiness, redness, or pain in the IV site or wound area.   You have heavy bleeding from your vagina, with or without clumps of tissue (clots).   Yellowish white fluid (pus) is coming from the wound.   There is a bad smell coming from the wound area.   The wound pulls apart.   You feel dizzy or feel faint.   You have pain or bleeding when you pee (urinate).     You have watery poop (diarrhea).   You feel sick to your stomach (nauseous) or throw up (vomit).   You have fluid (discharge) coming from your vagina.   You get a rash.   You have a reaction to your medicine.   You need stronger pain medicine.  MAKE SURE YOU:  Understand these instructions.   Will watch your condition.   Will get help right away if you are not doing well or get worse.  Document Released: 05/31/2008 Document Revised: 03/07/2011 Document Reviewed: 05/31/2008 [facility] Patient Information 2012 [place], [place].

## 2011-10-23 NOTE — Progress Notes (Signed)
Subjective: Patient reports tolerating PO, + flatus and no problems voiding.    Objective: I have reviewed patient's vital signs, intake and output, medications and labs.  General: alert and no distress GI: soft, non-tender; bowel sounds normal; no masses,  no organomegaly and incision: clean, dry and intact Extremities: extremities normal, atraumatic, no cyanosis or edema Vaginal Bleeding: none   Assessment/Plan: TAH/Right oophorectomy/Bilateral salpingectomy.  Doing well.  Discharge home.  LOS: 2 days    Cheyane Ayon A 10/23/2011, 9:45 AM

## 2011-10-23 NOTE — Progress Notes (Signed)
D/C instructions reviewed with pt.  Pt. States understanding of same.  No home equipment needed.  Staples removed from incision and replaced with 1/2" steri strips.  Pt. Tolerated without difficulty.  D/C'd home with son.  Wheelchair to car with staff without incident.

## 2011-10-23 NOTE — Discharge Summary (Signed)
Physician Discharge Summary  Patient ID: Michele Gilmore MRN: [id number] DOB/AGE: 07-Nov-1970 41 y.o.  Admit date: 10/21/2011 Discharge date: 10/23/2011  Admission Diagnoses:  Symptomatic uterine fibroids  Discharge Diagnoses:  Same Active Problems:  * No active hospital problems. *    Discharged Condition: good  Hospital Course: S/P TAH/Right oophorectomy/Bilateral salpingectomy.  Consults: None  Significant Diagnostic Studies: labs: CBC, CMET, pathology.  Treatments: surgery: TAH/Right oophorectomy/Bilateral salpingectomy.  Discharge Exam: Blood pressure 135/79, pulse 76, temperature 98.1 F (36.7 C), temperature source Oral, resp. rate 18, height 5\' 7"  (1.702 m), weight 157 lb (71.215 kg), last menstrual period 10/14/2011, SpO2 97.00%. General appearance: alert and no distress GI: soft, non-tender; bowel sounds normal; no masses,  no organomegaly Incision/Wound:  Clean, dry and intact.  Disposition:   Discharge Orders    Future Orders Please Complete By Expires   Diet - low sodium heart healthy      Increase activity slowly      Discharge instructions      Comments:   Routine   Discharge wound care:      Comments:   Keep incision clean and dry.  No tub baths for 2 weeks.   Call MD for:      Call MD for:  temperature >100.4      Call MD for:  persistant nausea and vomiting      Call MD for:  severe uncontrolled pain      Call MD for:  redness, tenderness, or signs of infection (pain, swelling, redness, odor or green/yellow discharge around incision site)      Call MD for:  difficulty breathing, headache or visual disturbances      Call MD for:  hives      Call MD for:  persistant dizziness or light-headedness      Call MD for:  extreme fatigue      Discharge patient        Medication List  As of 10/23/2011  9:58 AM   TAKE these medications         HYDROcodone-acetaminophen 7.5-750 MG per tablet   Commonly known as: VICODIN ES   Take 1 tablet by mouth every 6  (six) hours as needed. For pain      ibuprofen 800 MG tablet   Commonly known as: ADVIL,MOTRIN   Take 800 mg by mouth every 8 (eight) hours as needed.      INTEGRA F 125-1 MG Caps   Take 1 tablet by mouth daily.      lisinopril-hydrochlorothiazide 20-25 MG per tablet   Commonly known as: PRINZIDE,ZESTORETIC   Take 1 tablet by mouth every other day.      OVER THE COUNTER MEDICATION   Take 1 tablet by mouth daily. Raspberry ketone product from [facility]      oxyCODONE-acetaminophen 5-325 MG per tablet   Commonly known as: PERCOCET   Take 1-2 tablets by mouth every 3 (three) hours as needed (moderate to severe pain (when tolerating fluids)).      tinidazole 500 MG tablet   Commonly known as: TINDAMAX   Take 2 tablets (1,000 mg total) by mouth daily with breakfast.           Follow-up Information    Schedule an appointment as soon as possible for a visit with Brock Bad, MD.   Contact information:   [address] [place] [address] [phone]          Signed: Brock Bad 10/23/2011, 9:58  AM    

## 2011-10-24 ENCOUNTER — Encounter (HOSPITAL_COMMUNITY): Payer: Self-pay | Admitting: Obstetrics

## 2011-12-22 ENCOUNTER — Ambulatory Visit: Payer: 59 | Admitting: Family Medicine

## 2012-03-02 ENCOUNTER — Ambulatory Visit: Payer: 59 | Admitting: Family Medicine

## 2012-03-02 DIAGNOSIS — Z0289 Encounter for other administrative examinations: Secondary | ICD-10-CM

## 2012-11-26 ENCOUNTER — Ambulatory Visit: Payer: 59 | Admitting: Family Medicine

## 2013-01-22 ENCOUNTER — Encounter: Payer: Self-pay | Admitting: Family Medicine

## 2013-01-22 ENCOUNTER — Ambulatory Visit (INDEPENDENT_AMBULATORY_CARE_PROVIDER_SITE_OTHER): Payer: 59 | Admitting: Family Medicine

## 2013-01-22 ENCOUNTER — Encounter: Payer: Self-pay | Admitting: *Deleted

## 2013-01-22 VITALS — BP 122/84 | HR 72 | Temp 98.0°F | Ht 65.5 in | Wt 155.0 lb

## 2013-01-22 DIAGNOSIS — K649 Unspecified hemorrhoids: Secondary | ICD-10-CM | POA: Insufficient documentation

## 2013-01-22 DIAGNOSIS — I10 Essential (primary) hypertension: Secondary | ICD-10-CM | POA: Insufficient documentation

## 2013-01-22 DIAGNOSIS — J309 Allergic rhinitis, unspecified: Secondary | ICD-10-CM | POA: Insufficient documentation

## 2013-01-22 DIAGNOSIS — R51 Headache: Secondary | ICD-10-CM

## 2013-01-22 DIAGNOSIS — Z1231 Encounter for screening mammogram for malignant neoplasm of breast: Secondary | ICD-10-CM

## 2013-01-22 DIAGNOSIS — Z136 Encounter for screening for cardiovascular disorders: Secondary | ICD-10-CM

## 2013-01-22 LAB — COMPREHENSIVE METABOLIC PANEL
ALT: 17 U/L (ref 0–35)
BUN: 11 mg/dL (ref 6–23)
CO2: 26 mEq/L (ref 19–32)
Calcium: 9.3 mg/dL (ref 8.4–10.5)
Chloride: 104 mEq/L (ref 96–112)
Creatinine, Ser: 1 mg/dL (ref 0.4–1.2)
GFR: 79.84 mL/min (ref >60.00)
Glucose, Bld: 80 mg/dL (ref 70–99)

## 2013-01-22 LAB — LIPID PANEL: HDL: 59 mg/dL (ref >39.00)

## 2013-01-22 MED ORDER — HYDROCORTISONE ACETATE 25 MG RE SUPP: 25.0000 mg | Freq: Two times a day (BID) | RECTAL | Status: DC

## 2013-01-22 NOTE — Progress Notes (Signed)
Subjective:    Patient ID: Michele Gilmore, female    DOB: 05-28-1971, 42 y.o.   MRN: [id number]  HPI  Very pleasant 42 yo female here to establish care.  1.  HTN- has been on lisinopril HCTZ 20-25 mg for at least two year.  Denies any HA, blurred vision, dizziness, CP or SOB.  2.  Allergic rhinitis- has deteriorated this year.  Has tried antihistamines without much improvement.  3.  Hemorrhoids- can feel "two bumps" around her rectum that often bleed.  Does admit to chronic intermittent constipation.  No dark stools.  No pain with defecation.  She has never had a mammogram.  S/p hysterectomy last year for symptomatic fibroids.  Patient Active Problem List   Diagnosis Date Noted  . Allergic rhinitis 01/22/2013  . Hemorrhoid 01/22/2013  . HYPERTENSION 11/09/2007  . HEADACHE 11/09/2007   Past Medical History  Diagnosis Date  . Fibroids   . Heart murmur     as child, no problem  . Anemia   . Hypertension   . Cataract    Past Surgical History  Procedure Laterality Date  . Tubal ligation  06/2001  . Wisdom tooth extraction    . Svd      x 3  . Abdominal hysterectomy  10/21/2011    Procedure: HYSTERECTOMY ABDOMINAL;  Surgeon: Brock Bad, MD;  Location: [facility];  Service: Gynecology;  Laterality: N/A;  Right oophorectomy   History  Substance Use Topics  . Smoking status: Never Smoker   . Smokeless tobacco: Never Used  . Alcohol Use: Yes     Comment: socially   Family History  Problem Relation Age of Onset  . Hypertension Mother   . Stroke Mother   . Hypertension Father    No Known Allergies Current Outpatient Prescriptions on File Prior to Visit  Medication Sig Dispense Refill  . Fe Fum-FePoly-FA-Vit C-Vit B3 (INTEGRA F) 125-1 MG CAPS Take 1 tablet by mouth daily.      Marland Kitchen ibuprofen (ADVIL,MOTRIN) 800 MG tablet Take 800 mg by mouth every 8 (eight) hours as needed.      Marland Kitchen lisinopril-hydrochlorothiazide (PRINZIDE,ZESTORETIC) 20-25 MG per tablet Take 1 tablet by  mouth every other day.       No current facility-administered medications on file prior to visit.   The PMH, PSH, Social History, Family History, Medications, and allergies have been reviewed in [facility], and have been updated if relevant.   Review of Systems See HPI    Objective:   Physical Exam BP 122/84  Pulse 72  Temp(Src) 98 F (36.7 C)  Ht 5' 5.5" (1.664 m)  Wt 155 lb (70.308 kg)  BMI 25.39 kg/m2  LMP 10/14/2011  General:  Well-developed,well-nourished,in no acute distress; alert,appropriate and cooperative throughout examination Head:  normocephalic and atraumatic.   Eyes:  vision grossly intact, pupils equal, pupils round, and pupils reactive to light.   Ears:  R ear normal and L ear normal.   Nose:  no external deformity.   +nasal mucosal erythema, sinuses NTTP Mouth:  good dentition.   Neck:  No deformities, masses, or tenderness noted. Breasts:  No mass, nodules, thickening, tenderness, bulging, retraction, inflamation, nipple discharge or skin changes noted.   Lungs:  Normal respiratory effort, chest expands symmetrically. Lungs are clear to auscultation, no crackles or wheezes. Heart:  Normal rate and regular rhythm. S1 and S2 normal without gallop, murmur, click, rub or other extra sounds. Abdomen:  Bowel sounds positive,abdomen soft and non-tender without masses,  organomegaly or hernias noted. Rectal: two external hemorrhoids, non thrombosed Msk:  No deformity or scoliosis noted of thoracic or lumbar spine.   Extremities:  No clubbing, cyanosis, edema, or deformity noted with normal full range of motion of all joints.   Neurologic:  alert & oriented X3 and gait normal.   Skin:  Intact without suspicious lesions or rashes Psych:  Cognition and judgment appear intact. Alert and cooperative with normal attention span and concentration. No apparent delusions, illusions, hallucinations        Assessment & Plan:  1. HYPERTENSION Well controlled. No change in  rx.  - Comprehensive metabolic panel  2. Allergic rhinitis Deteriorated. Advised to add nasocort to anthistiamine (see AVS).  3. Other screening mammogram  - MM Digital Screening; Future  4. Screening for ischemic heart disease  - Lipid Panel  5. Hemorrhoid External, non thrombosed.  Discussed hemorrhoids and constipation. See AVS.

## 2013-01-22 NOTE — Patient Instructions (Addendum)
Please stop by to see Michele Gilmore on your way out to set up your referral.  Please start colace daily.  Try over the counter nasocort-start with 2 sprays per nostril per day...and then try to taper to 1 spray per nostril once symptoms improve.    Hemorrhoids Hemorrhoids are enlarged (dilated) veins around the rectum. There are 2 types of hemorrhoids, and the type of hemorrhoid is determined by its location. Internal hemorrhoids occur in the veins just inside the rectum.They are usually not painful, but they may bleed.However, they may poke through to the outside and become irritated and painful. External hemorrhoids involve the veins outside the anus and can be felt as a painful swelling or hard lump near the anus.They are often itchy and may crack and bleed. Sometimes clots will form in the veins. This makes them swollen and painful. These are called thrombosed hemorrhoids. CAUSES Causes of hemorrhoids include:  Pregnancy. This increases the pressure in the hemorrhoidal veins.  Constipation.  Straining to have a bowel movement.  Obesity.  Heavy lifting or other activity that caused you to strain. TREATMENT Most of the time hemorrhoids improve in 1 to 2 weeks. However, if symptoms do not seem to be getting better or if you have a lot of rectal bleeding, your caregiver may perform a procedure to help make the hemorrhoids get smaller or remove them completely.Possible treatments include:  Rubber band ligation. A rubber band is placed at the base of the hemorrhoid to cut off the circulation.  Sclerotherapy. A chemical is injected to shrink the hemorrhoid.  Infrared light therapy. Tools are used to burn the hemorrhoid.  Hemorrhoidectomy. This is surgical removal of the hemorrhoid. HOME CARE INSTRUCTIONS   Increase fiber in your diet. Ask your caregiver about using fiber supplements.  Drink enough water and fluids to keep your urine clear or pale yellow.  Exercise regularly.  Go to  the bathroom when you have the urge to have a bowel movement. Do not wait.  Avoid straining to have bowel movements.  Keep the anal area dry and clean.  Only take over-the-counter or prescription medicines for pain, discomfort, or fever as directed by your caregiver. If your hemorrhoids are thrombosed:  Take warm sitz baths for 20 to 30 minutes, 3 to 4 times per day.  If the hemorrhoids are very tender and swollen, place ice packs on the area as tolerated. Using ice packs between sitz baths may be helpful. Fill a plastic bag with ice. Place a towel between the bag of ice and your skin.  Medicated creams and suppositories may be used or applied as directed.  Do not use a donut-shaped pillow or sit on the toilet for long periods. This increases blood pooling and pain. SEEK MEDICAL CARE IF:   You have increasing pain and swelling that is not controlled with your medicine.  You have uncontrolled bleeding.  You have difficulty or you are unable to have a bowel movement.  You have pain or inflammation outside the area of the hemorrhoids.  You have chills or an oral temperature above 102 F (38.9 C). MAKE SURE YOU:   Understand these instructions.  Will watch your condition.  Will get help right away if you are not doing well or get worse. Document Released: 08/19/2000 Document Revised: 11/14/2011 Document Reviewed: 08/02/2010 [facility] Patient Information 2013 [place], [place].

## 2013-02-08 ENCOUNTER — Ambulatory Visit: Payer: 59

## 2013-02-22 ENCOUNTER — Ambulatory Visit (INDEPENDENT_AMBULATORY_CARE_PROVIDER_SITE_OTHER): Payer: 59 | Admitting: Family Medicine

## 2013-02-22 ENCOUNTER — Encounter: Payer: Self-pay | Admitting: Family Medicine

## 2013-02-22 VITALS — BP 144/82 | HR 82 | Temp 99.1°F | Wt 150.5 lb

## 2013-02-22 DIAGNOSIS — J019 Acute sinusitis, unspecified: Secondary | ICD-10-CM

## 2013-02-22 DIAGNOSIS — J020 Streptococcal pharyngitis: Secondary | ICD-10-CM

## 2013-02-22 LAB — POCT RAPID STREP A (OFFICE): Rapid Strep A Screen: NEGATIVE

## 2013-02-22 MED ORDER — AMOXICILLIN-POT CLAVULANATE 875-125 MG PO TABS: 1.0000 | ORAL_TABLET | Freq: Two times a day (BID) | ORAL | Status: DC

## 2013-02-22 NOTE — Progress Notes (Signed)
She was seen 1 month ago.  Since then the ST, post nasal gtt is worse.  Occ fevers, noted in last few days.  More aching recently.  R ear pain.  Not stuffy.  Used nasal steroid w/o much relief.  She has had trouble consistently for the last few weeks, worse in the last week.  More hoarse recently.    Meds, vitals, and allergies reviewed.   ROS: See HPI.  Otherwise, noncontributory.  GEN: nad, alert and oriented HEENT: mucous membranes moist, tm w/o erythema, nasal exam w/o erythema, clear discharge noted,  OP with cobblestoning, R max and frontal sinuses ttp NECK: supple w/o LA CV: rrr.   PULM: ctab, no inc wob EXT: no edema SKIN: no acute rash

## 2013-02-22 NOTE — Patient Instructions (Addendum)
[address] [employer].  Start the antibiotics today, drink plenty of fluids and let Dr. Dayton Martes know if you don't improve.  Take care.

## 2013-02-24 DIAGNOSIS — J019 Acute sinusitis, unspecified: Secondary | ICD-10-CM | POA: Insufficient documentation

## 2013-02-24 NOTE — Assessment & Plan Note (Signed)
Nontoxic, augmentin, supportive tx o/w. F/u with PCP prn. D/w pt.

## 2013-06-18 ENCOUNTER — Other Ambulatory Visit: Payer: Self-pay | Admitting: Family Medicine

## 2013-08-08 ENCOUNTER — Other Ambulatory Visit: Payer: Self-pay | Admitting: Family Medicine

## 2013-09-06 ENCOUNTER — Encounter: Payer: Self-pay | Admitting: *Deleted

## 2013-09-09 ENCOUNTER — Encounter: Payer: Self-pay | Admitting: Obstetrics

## 2013-10-23 ENCOUNTER — Other Ambulatory Visit: Payer: Self-pay | Admitting: Family Medicine

## 2013-10-23 NOTE — Telephone Encounter (Signed)
Last office visit 02/22/2013 with Dr. Damita Dunnings.  Ok to refill?

## 2013-12-17 ENCOUNTER — Ambulatory Visit: Payer: 59

## 2013-12-31 ENCOUNTER — Ambulatory Visit (INDEPENDENT_AMBULATORY_CARE_PROVIDER_SITE_OTHER): Payer: 59 | Admitting: Obstetrics

## 2013-12-31 ENCOUNTER — Encounter: Payer: Self-pay | Admitting: Obstetrics

## 2013-12-31 VITALS — BP 113/74 | HR 72 | Temp 97.8°F | Ht 67.0 in | Wt 155.0 lb

## 2013-12-31 DIAGNOSIS — R52 Pain, unspecified: Secondary | ICD-10-CM

## 2013-12-31 DIAGNOSIS — Z01419 Encounter for gynecological examination (general) (routine) without abnormal findings: Secondary | ICD-10-CM

## 2013-12-31 MED ORDER — PNV PRENATAL PLUS MULTIVITAMIN 27-1 MG PO TABS: 1.0000 | ORAL_TABLET | Freq: Every day | ORAL | Status: DC

## 2013-12-31 MED ORDER — IBUPROFEN 800 MG PO TABS: ORAL_TABLET | ORAL | Status: DC

## 2014-01-01 ENCOUNTER — Encounter: Payer: Self-pay | Admitting: Obstetrics

## 2014-01-01 LAB — WET PREP BY MOLECULAR PROBE
Candida species: NEGATIVE
Gardnerella vaginalis: POSITIVE — AB
TRICHOMONAS VAG: NEGATIVE

## 2014-01-01 NOTE — Progress Notes (Signed)
Subjective:     Michele Gilmore is a 43 y.o. female here for a routine exam.  Current complaints: None.    Personal health questionnaire:  Is patient Ashkenazi Jewish, have a family history of breast and/or ovarian cancer: no Is there a family history of uterine cancer diagnosed at age < 62, gastrointestinal cancer, urinary tract cancer, family member who is a Field seismologist syndrome-associated carrier: no Is the patient overweight and hypertensive, family history of diabetes, personal history of gestational diabetes or PCOS: no Is patient over 1, have PCOS,  family history of premature CHD under age 69, diabetes, smoke, have hypertension or peripheral artery disease:  no  The HPI was reviewed and explored in further detail by the provider. Gynecologic History Patient's last menstrual period was 10/14/2011. Contraception: status post hysterectomy Last Pap: n/a. Results were: n/a Last mammogram: none. Results were: n/a   Obstetric History OB History  Gravida Para Term Preterm AB SAB TAB Ectopic Multiple Living  5 3 3  0 2 2 0 0 0 3    # Outcome Date GA Lbr Len/2nd Weight Sex Delivery Anes PTL Lv  5 TRM 05/09/01 [redacted]w[redacted]d  7 lb 12 oz (3.515 kg) M SVD None  Y  4 SAB 2000 [redacted]w[redacted]d       N  3 TRM 08/20/92 [redacted]w[redacted]d  7 lb 14 oz (3.572 kg) F SVD None  Y  2 SAB 1992 [redacted]w[redacted]d       N  1 TRM 01/28/89   7 lb 5 oz (3.317 kg) M SVD None  Y      Past Medical History  Diagnosis Date  . Fibroids   . Heart murmur     as child, no problem  . Anemia   . Hypertension   . Cataract     Past Surgical History  Procedure Laterality Date  . Tubal ligation  06/2001  . Wisdom tooth extraction    . Svd      x 3  . Abdominal hysterectomy  10/21/2011    Procedure: HYSTERECTOMY ABDOMINAL;  Surgeon: Shelly Bombard, MD;  Location: [facility];  Service: Gynecology;  Laterality: N/A;  Right oophorectomy  . Eye surgery  2013    [facility]     Current outpatient prescriptions:Flaxseed, Linseed, (FLAX SEED OIL) 1000 MG  CAPS, Take 1 capsule by mouth daily., Disp: , Rfl: ;  ibuprofen (ADVIL,MOTRIN) 800 MG tablet, TAKE 1 TABLET BY MOUTH EVERY 8 HOURS AS NEEDED, Disp: 30 tablet, Rfl: 5;  lisinopril-hydrochlorothiazide (PRINZIDE,ZESTORETIC) 20-25 MG per tablet, TAKE 1 TABLET BY MOUTH DAILY, Disp: 30 tablet, Rfl: 5 Omega-3 Fatty Acids (FISH OIL) 1200 MG CAPS, Take one by mouth daily, Disp: , Rfl: ;  Prenatal Vit-Fe Fumarate-FA (PNV PRENATAL PLUS MULTIVITAMIN) 27-1 MG TABS, Take 1 tablet by mouth daily before breakfast., Disp: 30 tablet, Rfl: 11 No Known Allergies  History  Substance Use Topics  . Smoking status: Never Smoker   . Smokeless tobacco: Never Used  . Alcohol Use: Yes     Comment: socially    Family History  Problem Relation Age of Onset  . Hypertension Mother   . Stroke Mother   . Hypertension Father       Review of Systems  Constitutional: negative for fatigue and weight loss Respiratory: negative for cough and wheezing Cardiovascular: negative for chest pain, fatigue and palpitations Gastrointestinal: negative for abdominal pain and change in bowel habits Musculoskeletal:negative for myalgias Neurological: negative for gait problems and tremors Behavioral/Psych: negative for abusive  relationship, depression Endocrine: negative for temperature intolerance   Genitourinary:negative for abnormal menstrual periods, genital lesions, hot flashes, sexual problems and vaginal discharge Integument/breast: negative for breast lump, breast tenderness, nipple discharge and skin lesion(s)    Objective:       General:   alert  Skin:   no rash or abnormalities  Lungs:   clear to auscultation bilaterally  Heart:   regular rate and rhythm, S1, S2 normal, no murmur, click, rub or gallop  Breasts:   normal without suspicious masses, skin or nipple changes or axillary nodes  Abdomen:  normal findings: no organomegaly, soft, non-tender and no hernia  Pelvis:  External genitalia: normal general  appearance Urinary system: urethral meatus normal and bladder without fullness, nontender Vaginal: normal without tenderness, induration or masses Cervix: absent Adnexa:  absent Uterus: absent   Lab Review Urine pregnancy test Labs reviewed yes Radiologic studies reviewed no    Assessment:    Healthy female exam.    Plan:    Education reviewed: calcium supplements, low fat, low cholesterol diet, safe sex/STD prevention, self breast exams and weight bearing exercise. Mammogram ordered. Follow up in: 1 year.   Meds ordered this encounter  Medications  . Prenatal Vit-Fe Fumarate-FA (PNV PRENATAL PLUS MULTIVITAMIN) 27-1 MG TABS    Sig: Take 1 tablet by mouth daily before breakfast.    Dispense:  30 tablet    Refill:  11  . ibuprofen (ADVIL,MOTRIN) 800 MG tablet    Sig: TAKE 1 TABLET BY MOUTH EVERY 8 HOURS AS NEEDED    Dispense:  30 tablet    Refill:  5   Orders Placed This Encounter  Procedures  . WET PREP BY MOLECULAR PROBE

## 2014-01-28 ENCOUNTER — Other Ambulatory Visit: Payer: Self-pay | Admitting: *Deleted

## 2014-01-28 DIAGNOSIS — N760 Acute vaginitis: Secondary | ICD-10-CM

## 2014-01-28 DIAGNOSIS — B9689 Other specified bacterial agents as the cause of diseases classified elsewhere: Secondary | ICD-10-CM

## 2014-01-28 MED ORDER — METRONIDAZOLE 500 MG PO TABS: 500.0000 mg | ORAL_TABLET | Freq: Two times a day (BID) | ORAL | Status: DC

## 2014-01-28 MED ORDER — FLUCONAZOLE 150 MG PO TABS: 150.0000 mg | ORAL_TABLET | Freq: Once | ORAL | Status: DC

## 2014-03-11 ENCOUNTER — Ambulatory Visit (INDEPENDENT_AMBULATORY_CARE_PROVIDER_SITE_OTHER)
Admission: RE | Admit: 2014-03-11 | Discharge: 2014-03-11 | Disposition: A | Discharge status: Home / Self Care | Payer: 59 | Source: Ambulatory Visit | Attending: Family Medicine | Admitting: Family Medicine

## 2014-03-11 ENCOUNTER — Ambulatory Visit (INDEPENDENT_AMBULATORY_CARE_PROVIDER_SITE_OTHER): Payer: 59 | Admitting: Family Medicine

## 2014-03-11 ENCOUNTER — Encounter: Payer: Self-pay | Admitting: Family Medicine

## 2014-03-11 ENCOUNTER — Ambulatory Visit
Admission: RE | Admit: 2014-03-11 | Discharge: 2014-03-11 | Disposition: A | Discharge status: Home / Self Care | Payer: 59 | Source: Ambulatory Visit | Attending: Family Medicine | Admitting: Family Medicine

## 2014-03-11 ENCOUNTER — Telehealth: Payer: Self-pay | Admitting: Family Medicine

## 2014-03-11 VITALS — BP 110/68 | HR 69 | Temp 98.2°F | Ht 65.25 in | Wt 153.2 lb

## 2014-03-11 DIAGNOSIS — R61 Generalized hyperhidrosis: Secondary | ICD-10-CM | POA: Insufficient documentation

## 2014-03-11 DIAGNOSIS — M5442 Lumbago with sciatica, left side: Secondary | ICD-10-CM | POA: Insufficient documentation

## 2014-03-11 DIAGNOSIS — R5381 Other malaise: Secondary | ICD-10-CM | POA: Insufficient documentation

## 2014-03-11 DIAGNOSIS — Z113 Encounter for screening for infections with a predominantly sexual mode of transmission: Secondary | ICD-10-CM

## 2014-03-11 DIAGNOSIS — I10 Essential (primary) hypertension: Secondary | ICD-10-CM

## 2014-03-11 DIAGNOSIS — R5383 Other fatigue: Secondary | ICD-10-CM

## 2014-03-11 DIAGNOSIS — R319 Hematuria, unspecified: Secondary | ICD-10-CM

## 2014-03-11 DIAGNOSIS — R51 Headache: Secondary | ICD-10-CM

## 2014-03-11 DIAGNOSIS — M5430 Sciatica, unspecified side: Secondary | ICD-10-CM

## 2014-03-11 DIAGNOSIS — Z1231 Encounter for screening mammogram for malignant neoplasm of breast: Secondary | ICD-10-CM

## 2014-03-11 DIAGNOSIS — Z0000 Encounter for general adult medical examination without abnormal findings: Secondary | ICD-10-CM | POA: Insufficient documentation

## 2014-03-11 DIAGNOSIS — Z136 Encounter for screening for cardiovascular disorders: Secondary | ICD-10-CM

## 2014-03-11 LAB — COMPREHENSIVE METABOLIC PANEL
ALK PHOS: 60 U/L (ref 39–117)
ALT: 16 U/L (ref 0–35)
AST: 25 U/L (ref 0–37)
Albumin: 3.9 g/dL (ref 3.5–5.2)
BILIRUBIN TOTAL: 0.4 mg/dL (ref 0.2–1.2)
BUN: 13 mg/dL (ref 6–23)
CALCIUM: 9.7 mg/dL (ref 8.4–10.5)
CO2: 32 mEq/L (ref 19–32)
Chloride: 105 mEq/L (ref 96–112)
Creatinine, Ser: 1 mg/dL (ref 0.4–1.2)
GFR: 81.29 mL/min (ref >60.00)
Glucose, Bld: 96 mg/dL (ref 70–99)
Potassium: 4.4 mEq/L (ref 3.5–5.1)
Sodium: 139 mEq/L (ref 135–145)
Total Protein: 7.5 g/dL (ref 6.0–8.3)

## 2014-03-11 LAB — CBC WITH DIFFERENTIAL/PLATELET
BASOS PCT: 0.6 % (ref 0.0–3.0)
Basophils Absolute: 0 10*3/uL (ref 0.0–0.1)
Eosinophils Absolute: 0.1 10*3/uL (ref 0.0–0.7)
Eosinophils Relative: 2.9 % (ref 0.0–5.0)
HCT: 39.5 % (ref 36.0–46.0)
HEMOGLOBIN: 13.1 g/dL (ref 12.0–15.0)
LYMPHS ABS: 0.9 10*3/uL (ref 0.7–4.0)
LYMPHS PCT: 27.4 % (ref 12.0–46.0)
MCHC: 33.2 g/dL (ref 30.0–36.0)
MCV: 84.5 fl (ref 78.0–100.0)
MONO ABS: 0.2 10*3/uL (ref 0.1–1.0)
Monocytes Relative: 4.9 % (ref 3.0–12.0)
NEUTROS ABS: 2.2 10*3/uL (ref 1.4–7.7)
Neutrophils Relative %: 64.2 % (ref 43.0–77.0)
Platelets: 197 10*3/uL (ref 150.0–400.0)
RBC: 4.68 Mil/uL (ref 3.87–5.11)
RDW: 14.1 % (ref 11.5–15.5)
WBC: 3.4 10*3/uL — ABNORMAL LOW (ref 4.0–10.5)

## 2014-03-11 LAB — POCT URINALYSIS DIPSTICK
Bilirubin, UA: NEGATIVE
Glucose, UA: NEGATIVE
KETONES UA: NEGATIVE
Nitrite, UA: NEGATIVE
PH UA: 6
PROTEIN UA: NEGATIVE
Urobilinogen, UA: 0.2

## 2014-03-11 LAB — TSH: TSH: 2.46 u[IU]/mL (ref 0.35–4.50)

## 2014-03-11 LAB — VITAMIN B12: Vitamin B-12: 740 pg/mL (ref 211–911)

## 2014-03-11 LAB — LIPID PANEL
CHOL/HDL RATIO: 2
Cholesterol: 143 mg/dL (ref 0–200)
HDL: 61.4 mg/dL (ref >39.00)
LDL CALC: 74 mg/dL (ref 0–99)
NONHDL: 81.6
Triglycerides: 40 mg/dL (ref 0.0–149.0)
VLDL: 8 mg/dL (ref 0.0–40.0)

## 2014-03-11 MED ORDER — LISINOPRIL-HYDROCHLOROTHIAZIDE 20-25 MG PO TABS: ORAL_TABLET | ORAL | Status: DC

## 2014-03-11 NOTE — Progress Notes (Signed)
Pre visit review using our clinic review tool, if applicable. No additional management support is needed unless otherwise documented below in the visit note. 

## 2014-03-11 NOTE — Progress Notes (Signed)
Subjective:    Patient ID: Michele Gilmore, female    DOB: 1970/12/16, 43 y.o.   MRN: [id number]  HPI  Very pleasant 43 yo female here for CPX.  I have not seen her since she established care in 01/2013.  Dr. Jodi Gilmore is OBGYN- last saw him on 12/31/13- note reviewed. S/p TAH on 10/21/11 due to fibroids. Mammogram scheduled for this afternoon.  She has several concerns today:  1.  Left low back pain- intermittent for months.  At times ,does radiate to thigh.  No hematuria.  No dysuria.  NO nausea or vomiting.  Sometimes feels bloated.  No known injury.  2.  Night sweats and fatigues- intermittent since her hysterectomy in Michele Gilmore.  Still has one ovary.  3.  HTN- has been on lisinopril HCTZ 20-25 mg for at least two year.  Denies any HA, blurred vision, dizziness, CP or SOB.  Lab Results  Component Value Date   CHOL 136 01/22/2013   HDL 59.00 01/22/2013   LDLCALC 69 01/22/2013   TRIG 41.0 01/22/2013   CHOLHDL 2 01/22/2013   Lab Results  Component Value Date   CREATININE 1.0 01/22/2013   Lab Results  Component Value Date   WBC 6.7 10/22/2011   HGB 8.8* 10/22/2011   HCT 28.0* 10/22/2011   MCV 77.8* 10/22/2011   PLT 168 10/22/2011   Lab Results  Component Value Date   TSH 3.66 09/03/2010       Patient Active Problem List   Diagnosis Date Noted  . Routine general medical examination at a health care facility 03/11/2014  . Left-sided low back pain with left-sided sciatica 03/11/2014  . Allergic rhinitis 01/22/2013  . Hemorrhoid 01/22/2013  . HYPERTENSION 11/09/2007  . HEADACHE 11/09/2007   Past Medical History  Diagnosis Date  . Fibroids   . Heart murmur     as child, no problem  . Anemia   . Hypertension   . Cataract    Past Surgical History  Procedure Laterality Date  . Tubal ligation  06/2001  . Wisdom tooth extraction    . Svd      x 3  . Abdominal hysterectomy  10/21/2011    Procedure: HYSTERECTOMY ABDOMINAL;  Surgeon: Michele Bombard, MD;  Location: [facility];   Service: Gynecology;  Laterality: N/A;  Right oophorectomy  . Eye surgery  2013    [facility]    History  Substance Use Topics  . Smoking status: Never Smoker   . Smokeless tobacco: Never Used  . Alcohol Use: Yes     Comment: socially   Family History  Problem Relation Age of Onset  . Hypertension Mother   . Stroke Mother   . Hypertension Father    No Known Allergies Current Outpatient Prescriptions on File Prior to Visit  Medication Sig Dispense Refill  . Flaxseed, Linseed, (FLAX SEED OIL) 1000 MG CAPS Take 1 capsule by mouth daily.      Marland Kitchen ibuprofen (ADVIL,MOTRIN) 800 MG tablet TAKE 1 TABLET BY MOUTH EVERY 8 HOURS AS NEEDED  30 tablet  5  . Omega-3 Fatty Acids (FISH OIL) 1200 MG CAPS Take one by mouth daily      . Prenatal Vit-Fe Fumarate-FA (PNV PRENATAL PLUS MULTIVITAMIN) 27-1 MG TABS Take 1 tablet by mouth daily before breakfast.  30 tablet  11   No current facility-administered medications on file prior to visit.   The PMH, PSH, Social History, Family History, Medications, and allergies have been reviewed in [facility], and  have been updated if relevant.   Review of Systems See HPI    Denies changes in bowel habits No dysuria No CP or SOB- walks three to 10 miles several days per week Some swelling in her knees bilaterally intermittently- stands on concrete floors for over 8 hours per day Objective:   Physical Exam BP 110/68  Pulse 69  Temp(Src) 98.2 F (36.8 C) (Oral)  Ht 5' 5.25" (1.657 m)  Wt 153 lb 4 oz (69.514 kg)  BMI 25.32 kg/m2  SpO2 99%  LMP 10/14/2011  General:  Well-developed,well-nourished,in no acute distress; alert,appropriate and cooperative throughout examination Head:  normocephalic and atraumatic.   Eyes:  vision grossly intact, pupils equal, pupils round, and pupils reactive to light.   Ears:  R ear normal and L ear normal.   Nose:  no external deformity.   Mouth:  good dentition.   Neck:  No deformities, masses, or tenderness  noted. Lungs:  Normal respiratory effort, chest expands symmetrically. Lungs are clear to auscultation, no crackles or wheezes. Heart:  Normal rate and regular rhythm. S1 and S2 normal without gallop, murmur, click, rub or other extra sounds. Abdomen:  Bowel sounds positive,abdomen soft and non-tender without masses, organomegaly or hernias noted. Msk:  No deformity or scoliosis noted of thoracic or lumbar spine.   Extremities:  No clubbing, cyanosis, edema, or deformity noted with normal full range of motion of all joints.   No TTP over lumbar spine, SLR neg bilaterally Neurologic:  alert & oriented X3 and gait normal.   Skin:  Intact without suspicious lesions or rashes Psych:  Cognition and judgment appear intact. Alert and cooperative with normal attention span and concentration. No apparent delusions, illusions, hallucinations GYN/breast- deferred, saw Michele Gilmore:

## 2014-03-11 NOTE — Assessment & Plan Note (Signed)
?   Multifactorial- DJD of spine but I also question kidney stones given mod hematuria on UA.  Will get KUB and lumbar films, send urine for cx. The patient indicates understanding of these issues and agrees with the plan.

## 2014-03-11 NOTE — Telephone Encounter (Signed)
Relevant patient education assigned to patient using Emmi. ° °

## 2014-03-11 NOTE — Patient Instructions (Signed)
Good to see you. I will call you with your lab, xray, and urine results.

## 2014-03-11 NOTE — Assessment & Plan Note (Signed)
Reviewed preventive care protocols, scheduled due services, and updated immunizations Discussed nutrition, exercise, diet, and healthy lifestyle.  Orders Placed This Encounter  Procedures  . Urine culture  . DG Lumbar Spine Complete  . CBC with Differential  . Comprehensive metabolic panel  . Lipid panel  . TSH  . Vitamin B12  . Urinalysis Dipstick

## 2014-03-11 NOTE — Assessment & Plan Note (Signed)
Well controlled on current rx. No changes. 

## 2014-03-11 NOTE — Assessment & Plan Note (Signed)
Likely multifactorial.  Will start with lab work today. The patient indicates understanding of these issues and agrees with the plan.

## 2014-03-11 NOTE — Assessment & Plan Note (Signed)
?  menopausal.  Advised to talk to GYN about HRT. The patient indicates understanding of these issues and agrees with the plan.

## 2014-03-12 ENCOUNTER — Encounter: Payer: Self-pay | Admitting: *Deleted

## 2014-03-12 NOTE — Addendum Note (Signed)
Addended by: Marchia Bond on: 03/12/2014 08:47 AM   Modules accepted: Orders

## 2014-03-13 LAB — URINE CULTURE
Colony Count: NO GROWTH
Organism ID, Bacteria: NO GROWTH

## 2014-03-13 LAB — HIV ANTIBODY (ROUTINE TESTING W REFLEX): HIV: NONREACTIVE

## 2014-07-07 ENCOUNTER — Encounter: Payer: Self-pay | Admitting: Family Medicine

## 2014-08-17 ENCOUNTER — Other Ambulatory Visit: Payer: Self-pay | Admitting: Family Medicine

## 2014-10-23 ENCOUNTER — Encounter: Payer: Self-pay | Admitting: Internal Medicine

## 2014-10-23 ENCOUNTER — Ambulatory Visit (INDEPENDENT_AMBULATORY_CARE_PROVIDER_SITE_OTHER): Payer: 59 | Admitting: Internal Medicine

## 2014-10-23 ENCOUNTER — Ambulatory Visit (INDEPENDENT_AMBULATORY_CARE_PROVIDER_SITE_OTHER)
Admission: RE | Admit: 2014-10-23 | Discharge: 2014-10-23 | Disposition: A | Discharge status: Home / Self Care | Payer: 59 | Source: Ambulatory Visit | Attending: Internal Medicine | Admitting: Internal Medicine

## 2014-10-23 VITALS — BP 126/88 | HR 69 | Temp 97.8°F | Wt 153.5 lb

## 2014-10-23 DIAGNOSIS — M25469 Effusion, unspecified knee: Secondary | ICD-10-CM

## 2014-10-23 DIAGNOSIS — M25569 Pain in unspecified knee: Secondary | ICD-10-CM

## 2014-10-23 MED ORDER — MELOXICAM 15 MG PO TABS: 15.0000 mg | ORAL_TABLET | Freq: Every day | ORAL | Status: DC

## 2014-10-23 NOTE — Patient Instructions (Signed)
Knee Exercises EXERCISES RANGE OF MOTION (ROM) AND STRETCHING EXERCISES These exercises may help you when beginning to rehabilitate your injury. Your symptoms may resolve with or without further involvement from your physician, physical therapist, or athletic trainer. While completing these exercises, remember:   Restoring tissue flexibility helps normal motion to return to the joints. This allows healthier, less painful movement and activity.  An effective stretch should be held for at least 30 seconds.  A stretch should never be painful. You should only feel a gentle lengthening or release in the stretched tissue. STRETCH - Knee Extension, Prone  Lie on your stomach on a firm surface, such as a bed or countertop. Place your right / left knee and leg just beyond the edge of the surface. You may wish to place a towel under the far end of your right / left thigh for comfort.  Relax your leg muscles and allow gravity to straighten your knee. Your clinician may advise you to add an ankle weight if more resistance is helpful for you.  You should feel a stretch in the back of your right / left knee. Hold this position for __________ seconds. Repeat __________ times. Complete this stretch __________ times per day. * Your physician, physical therapist, or athletic trainer may ask you to add ankle weight to enhance your stretch.  RANGE OF MOTION - Knee Flexion, Active  Lie on your back with both knees straight. (If this causes back discomfort, bend your opposite knee, placing your foot flat on the floor.)  Slowly slide your heel back toward your buttocks until you feel a gentle stretch in the front of your knee or thigh.  Hold for __________ seconds. Slowly slide your heel back to the starting position. Repeat __________ times. Complete this exercise __________ times per day.  STRETCH - Quadriceps, Prone   Lie on your stomach on a firm surface, such as a bed or padded floor.  Bend your right /  left knee and grasp your ankle. If you are unable to reach your ankle or pant leg, use a belt around your foot to lengthen your reach.  Gently pull your heel toward your buttocks. Your knee should not slide out to the side. You should feel a stretch in the front of your thigh and/or knee.  Hold this position for __________ seconds. Repeat __________ times. Complete this stretch __________ times per day.  STRETCH - Hamstrings, Supine   Lie on your back. Loop a belt or towel over the ball of your right / left foot.  Straighten your right / left knee and slowly pull on the belt to raise your leg. Do not allow the right / left knee to bend. Keep your opposite leg flat on the floor.  Raise the leg until you feel a gentle stretch behind your right / left knee or thigh. Hold this position for __________ seconds. Repeat __________ times. Complete this stretch __________ times per day.  STRENGTHENING EXERCISES These exercises may help you when beginning to rehabilitate your injury. They may resolve your symptoms with or without further involvement from your physician, physical therapist, or athletic trainer. While completing these exercises, remember:   Muscles can gain both the endurance and the strength needed for everyday activities through controlled exercises.  Complete these exercises as instructed by your physician, physical therapist, or athletic trainer. Progress the resistance and repetitions only as guided.  You may experience muscle soreness or fatigue, but the pain or discomfort you are trying to eliminate   should never worsen during these exercises. If this pain does worsen, stop and make certain you are following the directions exactly. If the pain is still present after adjustments, discontinue the exercise until you can discuss the trouble with your clinician. STRENGTH - Quadriceps, Isometrics  Lie on your back with your right / left leg extended and your opposite knee  bent.  Gradually tense the muscles in the front of your right / left thigh. You should see either your knee cap slide up toward your hip or increased dimpling just above the knee. This motion will push the back of the knee down toward the floor/mat/bed on which you are lying.  Hold the muscle as tight as you can without increasing your pain for __________ seconds.  Relax the muscles slowly and completely in between each repetition. Repeat __________ times. Complete this exercise __________ times per day.  STRENGTH - Quadriceps, Short Arcs   Lie on your back. Place a __________ inch towel roll under your knee so that the knee slightly bends.  Raise only your lower leg by tightening the muscles in the front of your thigh. Do not allow your thigh to rise.  Hold this position for __________ seconds. Repeat __________ times. Complete this exercise __________ times per day.  OPTIONAL ANKLE WEIGHTS: Begin with ____________________, but DO NOT exceed ____________________. Increase in 1 pound/0.5 kilogram increments.  STRENGTH - Quadriceps, Straight Leg Raises  Quality counts! Watch for signs that the quadriceps muscle is working to insure you are strengthening the correct muscles and not "cheating" by substituting with healthier muscles.  Lay on your back with your right / left leg extended and your opposite knee bent.  Tense the muscles in the front of your right / left thigh. You should see either your knee cap slide up or increased dimpling just above the knee. Your thigh may even quiver.  Tighten these muscles even more and raise your leg 4 to 6 inches off the floor. Hold for __________ seconds.  Keeping these muscles tense, lower your leg.  Relax the muscles slowly and completely in between each repetition. Repeat __________ times. Complete this exercise __________ times per day.  STRENGTH - Hamstring, Curls  Lay on your stomach with your legs extended. (If you lay on a bed, your feet  may hang over the edge.)  Tighten the muscles in the back of your thigh to bend your right / left knee up to 90 degrees. Keep your hips flat on the bed/floor.  Hold this position for __________ seconds.  Slowly lower your leg back to the starting position. Repeat __________ times. Complete this exercise __________ times per day.  OPTIONAL ANKLE WEIGHTS: Begin with ____________________, but DO NOT exceed ____________________. Increase in 1 pound/0.5 kilogram increments.  STRENGTH - Quadriceps, Squats  Stand in a door frame so that your feet and knees are in line with the frame.  Use your hands for balance, not support, on the frame.  Slowly lower your weight, bending at the hips and knees. Keep your lower legs upright so that they are parallel with the door frame. Squat only within the range that does not increase your knee pain. Never let your hips drop below your knees.  Slowly return upright, pushing with your legs, not pulling with your hands. Repeat __________ times. Complete this exercise __________ times per day.  STRENGTH - Quadriceps, Wall Slides  Follow guidelines for form closely. Increased knee pain often results from poorly placed feet or knees.  Lean   against a smooth wall or door and walk your feet out 18-24 inches. Place your feet hip-width apart.  Slowly slide down the wall or door until your knees bend __________ degrees.* Keep your knees over your heels, not your toes, and in line with your hips, not falling to either side.  Hold for __________ seconds. Stand up to rest for __________ seconds in between each repetition. Repeat __________ times. Complete this exercise __________ times per day. * Your physician, physical therapist, or athletic trainer will alter this angle based on your symptoms and progress. Document Released: 07/06/2005 Document Revised: 01/06/2014 Document Reviewed: 12/04/2008 Exit[employer] Patient Information 2015 [employer]. This information is not  intended to replace advice given to you by your health care provider. Make sure you discuss any questions you have with your health care provider.  

## 2014-10-23 NOTE — Progress Notes (Signed)
Subjective:    Patient ID: Michele Gilmore, female    DOB: [birth year], 44 y.o.   MRN: [id number]  HPI  Pt presents to the clinic today with c/o bilateral knee pain and swelling. She reports this started 6 months ago. She reports the left is worse than the right. She has noticed some swelling. She reports the pain feels sharp at times.Ibuprofen without much relief. She denies any injury to the area. She does work long days in a factory, standing on a concrete floor, bending and squatting to lift boxes.  Review of Systems      Past Medical History  Diagnosis Date  . Fibroids   . Heart murmur     as child, no problem  . Anemia   . Hypertension   . Cataract     Current Outpatient Prescriptions  Medication Sig Dispense Refill  . Flaxseed, Linseed, (FLAX SEED OIL) 1000 MG CAPS Take 1 capsule by mouth daily.    Marland Kitchen ibuprofen (ADVIL,MOTRIN) 800 MG tablet TAKE 1 TABLET BY MOUTH EVERY 8 HOURS AS NEEDED 30 tablet 5  . lisinopril-hydrochlorothiazide (PRINZIDE,ZESTORETIC) 20-25 MG per tablet TAKE 1 TABLET BY MOUTH EVERY DAY 90 tablet 0  . Omega-3 Fatty Acids (FISH OIL) 1200 MG CAPS Take one by mouth daily    . Prenatal Vit-Fe Fumarate-FA (PNV PRENATAL PLUS MULTIVITAMIN) 27-1 MG TABS Take 1 tablet by mouth daily before breakfast. 30 tablet 11   No current facility-administered medications for this visit.    No Known Allergies  Family History  Problem Relation Age of Onset  . Hypertension Mother   . Stroke Mother   . Hypertension Father     History   Social History  . Marital Status: Single    Spouse Name: N/A  . Number of Children: N/A  . Years of Education: N/A   Occupational History  . Not on file.   Social History Main Topics  . Smoking status: Never Smoker   . Smokeless tobacco: Never Used  . Alcohol Use: Yes     Comment: socially  . Drug Use: No  . Sexual Activity: Not Currently    Birth Control/ Protection: Surgical     Comment: hysterectomy    Other Topics  Concern  . Not on file   Social History Narrative     Constitutional: Denies fever, malaise, fatigue, headache or abrupt weight changes.  Respiratory: Denies difficulty breathing, shortness of breath, cough or sputum production.   Cardiovascular: Denies chest pain, chest tightness, palpitations or swelling in the hands or feet.  Musculoskeletal: Pt reports knee pain and swelling. Denies difficulty with gait, muscle pain.  Skin: Denies redness, rashes, lesions or ulcercations.   No other specific complaints in a complete review of systems (except as listed in HPI above).  Objective:   Physical Exam  BP 126/88 mmHg  Pulse 69  Temp(Src) 97.8 F (36.6 C) (Oral)  Wt 153 lb 8 oz (69.627 kg)  SpO2 99%  LMP 10/14/2011 Wt Readings from Last 3 Encounters:  10/23/14 153 lb 8 oz (69.627 kg)  03/11/14 153 lb 4 oz (69.514 kg)  12/31/13 155 lb (70.308 kg)    General: Appears her stated age, well developed, well nourished in NAD. Skin: Warm, dry and intact. No rashes, lesions or ulcerations noted. Cardiovascular: Normal rate and rhythm. S1,S2 noted.  No murmur, rubs or gallops noted.  Pulmonary/Chest: Normal effort and positive vesicular breath sounds. No respiratory distress. No wheezes, rales or ronchi noted.  Musculoskeletal: Normal  flexion and extension of bilateral knees. Moderate peripatellar swelling noted. No crepitus noted with ROM. No pain with palpation of the bursa. No difficulty with gait.   BMET    Component Value Date/Time   NA 139 03/11/2014 0956   K 4.4 03/11/2014 0956   CL 105 03/11/2014 0956   CO2 32 03/11/2014 0956   GLUCOSE 96 03/11/2014 0956   GLUCOSE 86 07/25/2006 1102   BUN 13 03/11/2014 0956   CREATININE 1.0 03/11/2014 0956   CALCIUM 9.7 03/11/2014 0956   GFRNONAA >90 10/22/2011 0512   GFRAA >90 10/22/2011 0512    Lipid Panel     Component Value Date/Time   CHOL 143 03/11/2014 0956   TRIG 40.0 03/11/2014 0956   TRIG 48 07/25/2006 1102   HDL 61.40  03/11/2014 0956   CHOLHDL 2 03/11/2014 0956   CHOLHDL 2.5 CALC 07/25/2006 1102   VLDL 8.0 03/11/2014 0956   LDLCALC 74 03/11/2014 0956    CBC    Component Value Date/Time   WBC 3.4* 03/11/2014 0956   RBC 4.68 03/11/2014 0956   HGB 13.1 03/11/2014 0956   HCT 39.5 03/11/2014 0956   PLT 197.0 03/11/2014 0956   MCV 84.5 03/11/2014 0956   MCH 24.4* 10/22/2011 1118   MCHC 33.2 03/11/2014 0956   RDW 14.1 03/11/2014 0956   LYMPHSABS 0.9 03/11/2014 0956   MONOABS 0.2 03/11/2014 0956   EOSABS 0.1 03/11/2014 0956   BASOSABS 0.0 03/11/2014 0956    Hgb A1C No results found for: HGBA1C       Assessment & Plan:   Bilateral Knee pain, L>R:  Stop Advil and start Mobic daily Will obtain xray of left knee today Consider wearing Dr. Zoe Lan shoe inserts and knee braces while at work  Will follow up after xray is back, if no improvement, consider ortho referral

## 2014-10-23 NOTE — Progress Notes (Signed)
Pre visit review using our clinic review tool, if applicable. No additional management support is needed unless otherwise documented below in the visit note. 

## 2014-11-19 ENCOUNTER — Other Ambulatory Visit: Payer: Self-pay | Admitting: Internal Medicine

## 2014-11-19 NOTE — Telephone Encounter (Signed)
Last filled 10/23/14 by Joycelyn Das OV with you for CPE was 03/2014--please advise if okay to refill

## 2014-12-17 ENCOUNTER — Other Ambulatory Visit: Payer: Self-pay | Admitting: Obstetrics

## 2014-12-31 ENCOUNTER — Other Ambulatory Visit: Payer: Self-pay | Admitting: Family Medicine

## 2015-01-28 ENCOUNTER — Encounter: Payer: Self-pay | Admitting: Internal Medicine

## 2015-01-28 ENCOUNTER — Ambulatory Visit (INDEPENDENT_AMBULATORY_CARE_PROVIDER_SITE_OTHER): Payer: 59 | Admitting: Internal Medicine

## 2015-01-28 VITALS — BP 122/84 | HR 65 | Temp 98.0°F | Wt 156.0 lb

## 2015-01-28 DIAGNOSIS — N760 Acute vaginitis: Secondary | ICD-10-CM

## 2015-01-28 DIAGNOSIS — N898 Other specified noninflammatory disorders of vagina: Secondary | ICD-10-CM | POA: Diagnosis not present

## 2015-01-28 DIAGNOSIS — A499 Bacterial infection, unspecified: Secondary | ICD-10-CM | POA: Diagnosis not present

## 2015-01-28 DIAGNOSIS — B9689 Other specified bacterial agents as the cause of diseases classified elsewhere: Secondary | ICD-10-CM

## 2015-01-28 MED ORDER — METRONIDAZOLE 500 MG PO TABS: 500.0000 mg | ORAL_TABLET | Freq: Two times a day (BID) | ORAL | Status: DC

## 2015-01-28 NOTE — Progress Notes (Signed)
Pre visit review using our clinic review tool, if applicable. No additional management support is needed unless otherwise documented below in the visit note. 

## 2015-01-28 NOTE — Patient Instructions (Signed)
Bacterial Vaginosis Bacterial vaginosis is a vaginal infection that occurs when the normal balance of bacteria in the vagina is disrupted. It results from an overgrowth of certain bacteria. This is the most common vaginal infection in women of childbearing age. Treatment is important to prevent complications, especially in pregnant women, as it can cause a premature delivery. CAUSES  Bacterial vaginosis is caused by an increase in harmful bacteria that are normally present in smaller amounts in the vagina. Several different kinds of bacteria can cause bacterial vaginosis. However, the reason that the condition develops is not fully understood. RISK FACTORS Certain activities or behaviors can put you at an increased risk of developing bacterial vaginosis, including:  Having a new sex partner or multiple sex partners.  Douching.  Using an intrauterine device (IUD) for contraception. Women do not get bacterial vaginosis from toilet seats, bedding, swimming pools, or contact with objects around them. SIGNS AND SYMPTOMS  Some women with bacterial vaginosis have no signs or symptoms. Common symptoms include:  Grey vaginal discharge.  A fishlike odor with discharge, especially after sexual intercourse.  Itching or burning of the vagina and vulva.  Burning or pain with urination. DIAGNOSIS  Your health care provider will take a medical history and examine the vagina for signs of bacterial vaginosis. A sample of vaginal fluid may be taken. Your health care provider will look at this sample under a microscope to check for bacteria and abnormal cells. A vaginal pH test may also be done.  TREATMENT  Bacterial vaginosis may be treated with antibiotic medicines. These may be given in the form of a pill or a vaginal cream. A second round of antibiotics may be prescribed if the condition comes back after treatment.  HOME CARE INSTRUCTIONS   Only take over-the-counter or prescription medicines as  directed by your health care provider.  If antibiotic medicine was prescribed, take it as directed. Make sure you finish it even if you start to feel better.  Do not have sex until treatment is completed.  Tell all sexual partners that you have a vaginal infection. They should see their health care provider and be treated if they have problems, such as a mild rash or itching.  Practice safe sex by using condoms and only having one sex partner. SEEK MEDICAL CARE IF:   Your symptoms are not improving after 3 days of treatment.  You have increased discharge or pain.  You have a fever. MAKE SURE YOU:   Understand these instructions.  Will watch your condition.  Will get help right away if you are not doing well or get worse. FOR MORE INFORMATION  Centers for Disease Control and Prevention, Division of STD Prevention: www.cdc.gov/std American Sexual Health Association (ASHA): www.ashastd.org  Document Released: 08/22/2005 Document Revised: 06/12/2013 Document Reviewed: 04/03/2013 ExitCare Patient Information 2015 [employer]. This information is not intended to replace advice given to you by your health care provider. Make sure you discuss any questions you have with your health care provider.  

## 2015-01-28 NOTE — Progress Notes (Signed)
Subjective:    Patient ID: Michele Gilmore, female    DOB: [birth year], 44 y.o.   MRN: [id number]  HPI  Pt presents to the clinic today with c/o vaginal discharge. She reports this started 1 week ago. The discharge is thick and white. She denies itching or odor. She has had some associated abdominal cramping but denies fever or chills. She has not had an abnormal vaginal bleeding. She is sexually active.  Review of Systems      Past Medical History  Diagnosis Date  . Fibroids   . Heart murmur     as child, no problem  . Anemia   . Hypertension   . Cataract     Current Outpatient Prescriptions  Medication Sig Dispense Refill  . Flaxseed, Linseed, (FLAX SEED OIL) 1000 MG CAPS Take 1 capsule by mouth daily.    Marland Kitchen ibuprofen (ADVIL,MOTRIN) 800 MG tablet TAKE 1 TABLET BY MOUTH EVERY 8 HOURS AS NEEDED 30 tablet 0  . lisinopril-hydrochlorothiazide (PRINZIDE,ZESTORETIC) 20-25 MG per tablet TAKE 1 TABLET BY MOUTH EVERY DAY 90 tablet 0  . meloxicam (MOBIC) 15 MG tablet TAKE 1 TABLET BY MOUTH DAILY 30 tablet 2  . Omega-3 Fatty Acids (FISH OIL) 1200 MG CAPS Take one by mouth daily    . prednisoLONE acetate (PRED FORTE) 1 % ophthalmic suspension Place 1 drop into the right eye daily.  1  . Prenatal Vit-Fe Fumarate-FA (PNV PRENATAL PLUS MULTIVITAMIN) 27-1 MG TABS Take 1 tablet by mouth daily before breakfast. 30 tablet 11  . metroNIDAZOLE (FLAGYL) 500 MG tablet Take 1 tablet (500 mg total) by mouth 2 (two) times daily. 14 tablet 0   No current facility-administered medications for this visit.    No Known Allergies  Family History  Problem Relation Age of Onset  . Hypertension Mother   . Stroke Mother   . Hypertension Father     History   Social History  . Marital Status: Single    Spouse Name: N/A  . Number of Children: N/A  . Years of Education: N/A   Occupational History  . Not on file.   Social History Main Topics  . Smoking status: Never Smoker   . Smokeless tobacco:  Never Used  . Alcohol Use: Yes     Comment: socially  . Drug Use: No  . Sexual Activity: Not Currently    Birth Control/ Protection: Surgical     Comment: hysterectomy    Other Topics Concern  . Not on file   Social History Narrative     Constitutional: Denies fever, malaise, fatigue, headache or abrupt weight changes.  Respiratory: Denies difficulty breathing, shortness of breath, cough or sputum production.   Cardiovascular: Denies chest pain, chest tightness, palpitations or swelling in the hands or feet.  Gastrointestinal: Pt reports abdominal cramping. Denies abdominal, bloating, constipation, diarrhea or blood in the stool.  GU: Pt reports vaginal discharge. Denies urgency, frequency, pain with urination, burning sensation, blood in urine, odor.  No other specific complaints in a complete review of systems (except as listed in HPI above).  Objective:   Physical Exam   BP 122/84 mmHg  Pulse 65  Temp(Src) 98 F (36.7 C) (Oral)  Wt 156 lb (70.761 kg)  SpO2 99%  LMP 10/14/2011 Wt Readings from Last 3 Encounters:  01/28/15 156 lb (70.761 kg)  10/23/14 153 lb 8 oz (69.627 kg)  03/11/14 153 lb 4 oz (69.514 kg)    General: Appears her stated age, well developed, well  nourished in NAD.  Cardiovascular: Normal rate and rhythm. S1,S2 noted.  No murmur, rubs or gallops noted.  Pulmonary/Chest: Normal effort and positive vesicular breath sounds. No respiratory distress. No wheezes, rales or ronchi noted.  Abdomen: Soft and nontender. Normal bowel sounds, no bruits noted. No distention or masses noted.  Pelvic:  Normal female anatomy. Small amount of thin white discharge noted at vaginal opening.  BMET    Component Value Date/Time   NA 139 03/11/2014 0956   K 4.4 03/11/2014 0956   CL 105 03/11/2014 0956   CO2 32 03/11/2014 0956   GLUCOSE 96 03/11/2014 0956   GLUCOSE 86 07/25/2006 1102   BUN 13 03/11/2014 0956   CREATININE 1.0 03/11/2014 0956   CALCIUM 9.7 03/11/2014  0956   GFRNONAA >90 10/22/2011 0512   GFRAA >90 10/22/2011 0512    Lipid Panel     Component Value Date/Time   CHOL 143 03/11/2014 0956   TRIG 40.0 03/11/2014 0956   TRIG 48 07/25/2006 1102   HDL 61.40 03/11/2014 0956   CHOLHDL 2 03/11/2014 0956   CHOLHDL 2.5 CALC 07/25/2006 1102   VLDL 8.0 03/11/2014 0956   LDLCALC 74 03/11/2014 0956    CBC    Component Value Date/Time   WBC 3.4* 03/11/2014 0956   RBC 4.68 03/11/2014 0956   HGB 13.1 03/11/2014 0956   HCT 39.5 03/11/2014 0956   PLT 197.0 03/11/2014 0956   MCV 84.5 03/11/2014 0956   MCH 24.4* 10/22/2011 1118   MCHC 33.2 03/11/2014 0956   RDW 14.1 03/11/2014 0956   LYMPHSABS 0.9 03/11/2014 0956   MONOABS 0.2 03/11/2014 0956   EOSABS 0.1 03/11/2014 0956   BASOSABS 0.0 03/11/2014 0956    Hgb A1C No results found for: HGBA1C      Assessment & Plan:   Vaginal discharge secondary to bacterial vaginosis:  Wet prep:  + clue cells, no yeast, no trich Urine GC probe She declines testing for HIV, Hep C or RPR eRx for Flagyl 500 mg BID x 7 days  RTC aa needed or if symptoms persist or worsen

## 2015-01-29 LAB — GC/CHLAMYDIA PROBE AMP, URINE
Chlamydia, Swab/Urine, PCR: NEGATIVE
GC Probe Amp, Urine: NEGATIVE

## 2015-02-03 ENCOUNTER — Ambulatory Visit: Payer: Self-pay | Admitting: Certified Nurse Midwife

## 2015-03-17 ENCOUNTER — Other Ambulatory Visit: Payer: Self-pay | Admitting: Family Medicine

## 2015-03-26 ENCOUNTER — Other Ambulatory Visit: Payer: Self-pay | Admitting: Obstetrics

## 2015-03-26 NOTE — Telephone Encounter (Signed)
Please review

## 2015-04-23 ENCOUNTER — Ambulatory Visit (INDEPENDENT_AMBULATORY_CARE_PROVIDER_SITE_OTHER): Payer: 59 | Admitting: Obstetrics

## 2015-04-23 ENCOUNTER — Encounter: Payer: Self-pay | Admitting: Obstetrics

## 2015-04-23 VITALS — BP 103/71 | HR 61 | Temp 98.6°F | Wt 159.0 lb

## 2015-04-23 DIAGNOSIS — R102 Pelvic and perineal pain: Secondary | ICD-10-CM

## 2015-04-23 DIAGNOSIS — Z01419 Encounter for gynecological examination (general) (routine) without abnormal findings: Secondary | ICD-10-CM

## 2015-04-23 DIAGNOSIS — Z124 Encounter for screening for malignant neoplasm of cervix: Secondary | ICD-10-CM | POA: Diagnosis not present

## 2015-04-23 DIAGNOSIS — B372 Candidiasis of skin and nail: Secondary | ICD-10-CM | POA: Diagnosis not present

## 2015-04-23 MED ORDER — CICLOPIROX OLAMINE 0.77 % EX CREA: TOPICAL_CREAM | Freq: Two times a day (BID) | CUTANEOUS | Status: DC

## 2015-04-23 MED ORDER — IBUPROFEN 800 MG PO TABS: 800.0000 mg | ORAL_TABLET | Freq: Three times a day (TID) | ORAL | Status: DC | PRN

## 2015-04-23 MED ORDER — PNV PRENATAL PLUS MULTIVITAMIN 27-1 MG PO TABS: 1.0000 | ORAL_TABLET | Freq: Every day | ORAL | Status: DC

## 2015-04-24 ENCOUNTER — Encounter: Payer: Self-pay | Admitting: Obstetrics

## 2015-04-24 NOTE — Progress Notes (Signed)
Subjective:        Michele Gilmore is a 44 y.o. female here for a routine exam.  Current complaints: Left sided pain that occurs ~ once a week.  She is S/P TAH / Right Oophorectomy / BS.  History significant for chronic constipation, and she complains od a painful hemorrhoid.  Personal health questionnaire:  Is patient Ashkenazi Jewish, have a family history of breast and/or ovarian cancer: no Is there a family history of uterine cancer diagnosed at age < 31, gastrointestinal cancer, urinary tract cancer, family member who is a Field seismologist syndrome-associated carrier: no Is the patient overweight and hypertensive, family history of diabetes, personal history of gestational diabetes, preeclampsia or PCOS: no Is patient over 74, have PCOS,  family history of premature CHD under age 46, diabetes, smoke, have hypertension or peripheral artery disease:  no At any time, has a partner hit, kicked or otherwise hurt or frightened you?: no Over the past 2 weeks, have you felt down, depressed or hopeless?: no Over the past 2 weeks, have you felt little interest or pleasure in doing things?:no   Gynecologic History Patient's last menstrual period was 10/14/2011. Contraception: status post hysterectomy Last Pap: n/a. Results were: normal Last mammogram: 2015. Results were: normal  Obstetric History OB History  Gravida Para Term Preterm AB SAB TAB Ectopic Multiple Living  5 3 3  0 2 2 0 0 0 3    # Outcome Date GA Lbr Len/2nd Weight Sex Delivery Anes PTL Lv  5 Term 05/09/01 [redacted]w[redacted]d  7 lb 12 oz (3.515 kg) M Vag-Spont None  Y  4 SAB 2000 [redacted]w[redacted]d       N  3 Term 08/20/92 [redacted]w[redacted]d  7 lb 14 oz (3.572 kg) F Vag-Spont None  Y  2 SAB 1992 [redacted]w[redacted]d       N  1 Term 01/28/89   7 lb 5 oz (3.317 kg) M Vag-Spont None  Y      Past Medical History  Diagnosis Date  . Fibroids   . Heart murmur     as child, no problem  . Anemia   . Hypertension   . Cataract     Past Surgical History  Procedure Laterality Date  .  Tubal ligation  06/2001  . Wisdom tooth extraction    . Svd      x 3  . Abdominal hysterectomy  10/21/2011    Procedure: HYSTERECTOMY ABDOMINAL;  Surgeon: Shelly Bombard, MD;  Location: [facility];  Service: Gynecology;  Laterality: N/A;  Right oophorectomy  . Eye surgery  2013    [facility]      Current outpatient prescriptions:  .  Flaxseed, Linseed, (FLAX SEED OIL) 1000 MG CAPS, Take 1 capsule by mouth daily., Disp: , Rfl:  .  lisinopril-hydrochlorothiazide (PRINZIDE,ZESTORETIC) 20-25 MG per tablet, Take one tablet daily **MUST HAVE PHYSICAL FOR FURTHER REFILLS**, Disp: 30 tablet, Rfl: 0 .  meloxicam (MOBIC) 15 MG tablet, TAKE 1 TABLET BY MOUTH DAILY, Disp: 30 tablet, Rfl: 2 .  Omega-3 Fatty Acids (FISH OIL) 1200 MG CAPS, Take one by mouth daily, Disp: , Rfl:  .  ciclopirox (LOPROX) 0.77 % cream, Apply topically 2 (two) times daily., Disp: 90 g, Rfl: 2 .  ibuprofen (ADVIL,MOTRIN) 800 MG tablet, Take 1 tablet (800 mg total) by mouth every 8 (eight) hours as needed., Disp: 30 tablet, Rfl: 5 .  prednisoLONE acetate (PRED FORTE) 1 % ophthalmic suspension, Place 1 drop into the right eye daily., Disp: ,  Rfl: 1 .  Prenatal Vit-Fe Fumarate-FA (PNV PRENATAL PLUS MULTIVITAMIN) 27-1 MG TABS, Take 1 tablet by mouth daily before breakfast., Disp: 30 tablet, Rfl: 11 No Known Allergies  Social History  Substance Use Topics  . Smoking status: Never Smoker   . Smokeless tobacco: Never Used  . Alcohol Use: 0.0 oz/week    0 Standard drinks or equivalent per week     Comment: socially    Family History  Problem Relation Age of Onset  . Hypertension Mother   . Stroke Mother   . Hypertension Father       Review of Systems  Constitutional: negative for fatigue and weight loss Respiratory: negative for cough and wheezing Cardiovascular: negative for chest pain, fatigue and palpitations Gastrointestinal: positive for abdominal pain and constipation Musculoskeletal:negative for  myalgias Neurological: negative for gait problems and tremors Behavioral/Psych: negative for abusive relationship, depression Endocrine: negative for temperature intolerance   Genitourinary:negative for abnormal menstrual periods, genital lesions, hot flashes, sexual problems and vaginal discharge Integument/breast: negative for breast lump, breast tenderness, nipple discharge and skin lesion(s)    Objective:       BP 103/71 mmHg  Pulse 61  Temp(Src) 98.6 F (37 C)  Wt 159 lb (72.122 kg)  LMP 10/14/2011 General:   alert  Skin:   no rash or abnormalities  Lungs:   clear to auscultation bilaterally  Heart:   regular rate and rhythm, S1, S2 normal, no murmur, click, rub or gallop  Breasts:   normal without suspicious masses, skin or nipple changes or axillary nodes  Abdomen:  normal findings: no organomegaly, soft, non-tender and no hernia  Pelvis:  External genitalia: normal general appearance Urinary system: urethral meatus normal and bladder without fullness, nontender Vaginal: normal without tenderness, induration or masses Cervix: absent Adnexa: fullness felt in RLQ, non tender. Uterus: absent   Lab Review Urine pregnancy test Labs reviewed yes Radiologic studies reviewed yes    Assessment:    Healthy female exam.    Pelvic pain   Plan:   Ultrasound ordered  Education reviewed: calcium supplements, low fat, low cholesterol diet, self breast exams and weight bearing exercise. Mammogram ordered. Follow up in: 1 year.   Meds ordered this encounter  Medications  . ibuprofen (ADVIL,MOTRIN) 800 MG tablet    Sig: Take 1 tablet (800 mg total) by mouth every 8 (eight) hours as needed.    Dispense:  30 tablet    Refill:  5  . Prenatal Vit-Fe Fumarate-FA (PNV PRENATAL PLUS MULTIVITAMIN) 27-1 MG TABS    Sig: Take 1 tablet by mouth daily before breakfast.    Dispense:  30 tablet    Refill:  11  . ciclopirox (LOPROX) 0.77 % cream    Sig: Apply topically 2 (two) times  daily.    Dispense:  90 g    Refill:  2   Orders Placed This Encounter  Procedures  . SureSwab, Vaginosis/Vaginitis Plus  . US Pelvis Complete    Standing Status: Future     Number of Occurrences:      Standing Expiration Date: 06/22/2016    Order Specific Question:  Reason for Exam (SYMPTOM  OR DIAGNOSIS REQUIRED)    Answer:  pelvic pain ( LLQ ).  S/P Hysterectomy / Bilateral Salpingectomy / Right Oophorectomy    Order Specific Question:  Preferred imaging location?    Answer:  [facility]  . US Transvaginal Non-OB    Standing Status: Future     Number of Occurrences:  Standing Expiration Date: 06/22/2016    Order Specific Question:  Reason for Exam (SYMPTOM  OR DIAGNOSIS REQUIRED)    Answer:  pelvic pain ( LLQ ).  S/P Hysterectomy / Bilateral Salpingectomy / Right Oophorectomy    Order Specific Question:  Preferred imaging location?    Answer:  [facility]

## 2015-04-28 ENCOUNTER — Other Ambulatory Visit: Payer: Self-pay | Admitting: Obstetrics

## 2015-04-28 DIAGNOSIS — B9689 Other specified bacterial agents as the cause of diseases classified elsewhere: Secondary | ICD-10-CM

## 2015-04-28 DIAGNOSIS — B3731 Acute candidiasis of vulva and vagina: Secondary | ICD-10-CM

## 2015-04-28 DIAGNOSIS — B373 Candidiasis of vulva and vagina: Secondary | ICD-10-CM

## 2015-04-28 DIAGNOSIS — N760 Acute vaginitis: Secondary | ICD-10-CM

## 2015-04-28 LAB — SURESWAB, VAGINOSIS/VAGINITIS PLUS
ATOPOBIUM VAGINAE: NOT DETECTED Log (cells/mL)
BV CATEGORY: UNDETERMINED — AB
C. PARAPSILOSIS, DNA: DETECTED — AB
C. albicans, DNA: DETECTED — AB
C. glabrata, DNA: NOT DETECTED
C. trachomatis RNA, TMA: NOT DETECTED
C. tropicalis, DNA: NOT DETECTED
Gardnerella vaginalis: 6.5 Log (cells/mL)
LACTOBACILLUS SPECIES: 7.8 Log (cells/mL)
MEGASPHAERA SPECIES: NOT DETECTED Log (cells/mL)
N. GONORRHOEAE RNA, TMA: NOT DETECTED
T. vaginalis RNA, QL TMA: NOT DETECTED

## 2015-04-28 MED ORDER — TINIDAZOLE 500 MG PO TABS: 1000.0000 mg | ORAL_TABLET | Freq: Every day | ORAL | Status: DC

## 2015-04-28 MED ORDER — FLUCONAZOLE 100 MG PO TABS: 100.0000 mg | ORAL_TABLET | Freq: Every day | ORAL | Status: DC

## 2015-04-30 ENCOUNTER — Other Ambulatory Visit: Payer: Self-pay | Admitting: *Deleted

## 2015-04-30 DIAGNOSIS — B9689 Other specified bacterial agents as the cause of diseases classified elsewhere: Secondary | ICD-10-CM

## 2015-04-30 DIAGNOSIS — B372 Candidiasis of skin and nail: Secondary | ICD-10-CM

## 2015-04-30 DIAGNOSIS — N760 Acute vaginitis: Secondary | ICD-10-CM

## 2015-04-30 MED ORDER — METRONIDAZOLE 500 MG PO TABS
500.0000 mg | ORAL_TABLET | Freq: Two times a day (BID) | ORAL | Status: DC
Start: 2015-04-30 — End: 2015-06-09

## 2015-04-30 MED ORDER — CICLOPIROX OLAMINE 0.77 % EX CREA: TOPICAL_CREAM | Freq: Two times a day (BID) | CUTANEOUS | Status: DC

## 2015-05-04 ENCOUNTER — Ambulatory Visit (HOSPITAL_COMMUNITY): Payer: 59

## 2015-05-15 ENCOUNTER — Other Ambulatory Visit: Payer: Self-pay

## 2015-05-15 ENCOUNTER — Telehealth: Payer: Self-pay | Admitting: *Deleted

## 2015-05-15 MED ORDER — LISINOPRIL-HYDROCHLOROTHIAZIDE 20-25 MG PO TABS: ORAL_TABLET | ORAL | Status: DC

## 2015-05-15 NOTE — Telephone Encounter (Signed)
Patient is calling wanting Dr Jodi Mourning to refill her Lisinopril.  9/9 10:38 LM on VM- Patient needs to follow up with her primary for her BP care- can not refill that medication. Patient should keep her appointment 9/13.

## 2015-05-15 NOTE — Telephone Encounter (Signed)
Pt request refill lisinopril-HCTZ to [facility]. Pt has been out of med for 3 days and has beginning of h/a. Refill done # 30 and pt will keep appt on 05/19/15.

## 2015-05-19 ENCOUNTER — Encounter: Payer: 59 | Admitting: Family Medicine

## 2015-06-09 ENCOUNTER — Ambulatory Visit (INDEPENDENT_AMBULATORY_CARE_PROVIDER_SITE_OTHER): Payer: 59 | Admitting: Family Medicine

## 2015-06-09 VITALS — BP 114/72 | HR 76 | Temp 98.0°F | Ht 65.25 in | Wt 157.5 lb

## 2015-06-09 DIAGNOSIS — Z0000 Encounter for general adult medical examination without abnormal findings: Secondary | ICD-10-CM

## 2015-06-09 DIAGNOSIS — J209 Acute bronchitis, unspecified: Secondary | ICD-10-CM

## 2015-06-09 DIAGNOSIS — I10 Essential (primary) hypertension: Secondary | ICD-10-CM

## 2015-06-09 LAB — LIPID PANEL
CHOLESTEROL: 142 mg/dL (ref 0–200)
HDL: 64.9 mg/dL (ref >39.00)
LDL Cholesterol: 58 mg/dL (ref 0–99)
NONHDL: 76.63
TRIGLYCERIDES: 92 mg/dL (ref 0.0–149.0)
Total CHOL/HDL Ratio: 2
VLDL: 18.4 mg/dL (ref 0.0–40.0)

## 2015-06-09 LAB — COMPREHENSIVE METABOLIC PANEL
ALBUMIN: 3.9 g/dL (ref 3.5–5.2)
ALK PHOS: 56 U/L (ref 39–117)
ALT: 17 U/L (ref 0–35)
AST: 25 U/L (ref 0–37)
BILIRUBIN TOTAL: 0.8 mg/dL (ref 0.2–1.2)
BUN: 8 mg/dL (ref 6–23)
CALCIUM: 9.6 mg/dL (ref 8.4–10.5)
CO2: 27 mEq/L (ref 19–32)
Chloride: 105 mEq/L (ref 96–112)
Creatinine, Ser: 0.87 mg/dL (ref 0.40–1.20)
GFR: 91.61 mL/min (ref >60.00)
Glucose, Bld: 87 mg/dL (ref 70–99)
POTASSIUM: 3.6 meq/L (ref 3.5–5.1)
Sodium: 138 mEq/L (ref 135–145)
TOTAL PROTEIN: 7.1 g/dL (ref 6.0–8.3)

## 2015-06-09 LAB — CBC WITH DIFFERENTIAL/PLATELET
BASOS ABS: 0 10*3/uL (ref 0.0–0.1)
Basophils Relative: 0.5 % (ref 0.0–3.0)
EOS PCT: 5.7 % — AB (ref 0.0–5.0)
Eosinophils Absolute: 0.2 10*3/uL (ref 0.0–0.7)
HEMATOCRIT: 38.8 % (ref 36.0–46.0)
Hemoglobin: 12.7 g/dL (ref 12.0–15.0)
LYMPHS ABS: 1.5 10*3/uL (ref 0.7–4.0)
LYMPHS PCT: 34.5 % (ref 12.0–46.0)
MCHC: 32.7 g/dL (ref 30.0–36.0)
MCV: 84.4 fl (ref 78.0–100.0)
MONOS PCT: 5 % (ref 3.0–12.0)
Monocytes Absolute: 0.2 10*3/uL (ref 0.1–1.0)
NEUTROS PCT: 54.3 % (ref 43.0–77.0)
Neutro Abs: 2.3 10*3/uL (ref 1.4–7.7)
Platelets: 176 10*3/uL (ref 150.0–400.0)
RBC: 4.6 Mil/uL (ref 3.87–5.11)
RDW: 14 % (ref 11.5–15.5)
WBC: 4.3 10*3/uL (ref 4.0–10.5)

## 2015-06-09 LAB — TSH: TSH: 3.03 u[IU]/mL (ref 0.35–4.50)

## 2015-06-09 MED ORDER — LISINOPRIL-HYDROCHLOROTHIAZIDE 20-25 MG PO TABS: ORAL_TABLET | ORAL | Status: DC

## 2015-06-09 MED ORDER — AMOXICILLIN-POT CLAVULANATE 875-125 MG PO TABS: 1.0000 | ORAL_TABLET | Freq: Two times a day (BID) | ORAL | Status: AC

## 2015-06-09 NOTE — Assessment & Plan Note (Signed)
New- Given duration and progression of symptoms, will treat for bacterial process with augmentin. Call or return to clinic prn if these symptoms worsen or fail to improve as anticipated. The patient indicates understanding of these issues and agrees with the plan.

## 2015-06-09 NOTE — Assessment & Plan Note (Signed)
Well controlled on current rx. No changes made. 

## 2015-06-09 NOTE — Progress Notes (Signed)
Subjective:    Patient ID: Michele Gilmore, female    DOB: [birth year], 44 y.o.   MRN: [id number]  HPI  Very pleasant 44 yo female here for CPX and follow up of chronic medical conditions.  I have not seen her since she established care in 01/2013.  Dr. Jodi Mourning is OBGYN- last saw him on 04/23/15- note reviewed. Complained of pelvic pain- ultrasound ordered but she has not scheduled this yet.  Feels it has improved. S/p TAH on 10/21/11 due to fibroids.   HTN- Taking lisinopril HCTZ 20-25 mg. Denies any HA, blurred vision, dizziness, CP or SOB.  Upper respiratory infection- cough, sinus pressure and chills x 10 days.  Feels getting worse despite theraflu and OTC cough suppressants. Denies fever. No CP or SOB. Non smoker  Lab Results  Component Value Date   CHOL 143 03/11/2014   HDL 61.40 03/11/2014   LDLCALC 74 03/11/2014   TRIG 40.0 03/11/2014   CHOLHDL 2 03/11/2014   Lab Results  Component Value Date   CREATININE 1.0 03/11/2014   Lab Results  Component Value Date   WBC 3.4* 03/11/2014   HGB 13.1 03/11/2014   HCT 39.5 03/11/2014   MCV 84.5 03/11/2014   PLT 197.0 03/11/2014   Lab Results  Component Value Date   TSH 2.46 03/11/2014       Patient Active Problem List   Diagnosis Date Noted  . Routine general medical examination at a health care facility 03/11/2014  . Other malaise and fatigue 03/11/2014  . Allergic rhinitis 01/22/2013  . Hemorrhoid 01/22/2013  . HYPERTENSION 11/09/2007  . HEADACHE 11/09/2007   Past Medical History  Diagnosis Date  . Fibroids   . Heart murmur     as child, no problem  . Anemia   . Hypertension   . Cataract    Past Surgical History  Procedure Laterality Date  . Tubal ligation  06/2001  . Wisdom tooth extraction    . Svd      x 3  . Abdominal hysterectomy  10/21/2011    Procedure: HYSTERECTOMY ABDOMINAL;  Surgeon: Shelly Bombard, MD;  Location: [facility];  Service: Gynecology;  Laterality: N/A;  Right oophorectomy  . Eye  surgery  2013    [facility]    Social History  Substance Use Topics  . Smoking status: Never Smoker   . Smokeless tobacco: Never Used  . Alcohol Use: 0.0 oz/week    0 Standard drinks or equivalent per week     Comment: socially   Family History  Problem Relation Age of Onset  . Hypertension Mother   . Stroke Mother   . Hypertension Father    No Known Allergies Current Outpatient Prescriptions on File Prior to Visit  Medication Sig Dispense Refill  . ciclopirox (LOPROX) 0.77 % cream Apply topically 2 (two) times daily. 90 g 2  . Flaxseed, Linseed, (FLAX SEED OIL) 1000 MG CAPS Take 1 capsule by mouth daily.    Marland Kitchen ibuprofen (ADVIL,MOTRIN) 800 MG tablet Take 1 tablet (800 mg total) by mouth every 8 (eight) hours as needed. 30 tablet 5  . lisinopril-hydrochlorothiazide (PRINZIDE,ZESTORETIC) 20-25 MG per tablet Take one tablet daily **MUST HAVE PHYSICAL FOR FURTHER REFILLS** 30 tablet 0  . meloxicam (MOBIC) 15 MG tablet TAKE 1 TABLET BY MOUTH DAILY 30 tablet 2  . Omega-3 Fatty Acids (FISH OIL) 1200 MG CAPS Take one by mouth daily    . Prenatal Vit-Fe Fumarate-FA (PNV PRENATAL PLUS MULTIVITAMIN) 27-1 MG TABS Take  1 tablet by mouth daily before breakfast. 30 tablet 11   No current facility-administered medications on file prior to visit.   The PMH, PSH, Social History, Family History, Medications, and allergies have been reviewed in [facility], and have been updated if relevant.   Review of Systems  Constitutional: Positive for chills.  HENT: Positive for congestion, ear pain, postnasal drip, rhinorrhea and sinus pressure.   Respiratory: Positive for cough.   Cardiovascular: Negative.   Gastrointestinal: Negative.   Endocrine: Negative.   Genitourinary: Negative.   Musculoskeletal: Negative.   Skin: Negative.   Allergic/Immunologic: Negative.   Neurological: Negative.   Psychiatric/Behavioral: Negative.   All other systems reviewed and are negative.   Objective:   Physical  Exam BP 114/72 mmHg  Pulse 76  Temp(Src) 98 F (36.7 C) (Oral)  Ht 5' 5.25" (1.657 m)  Wt 157 lb 8 oz (71.442 kg)  BMI 26.02 kg/m2  SpO2 99%  LMP 10/14/2011  General:  Well-developed,well-nourished,in no acute distress; alert,appropriate and cooperative throughout examination Head:  normocephalic and atraumatic.   Eyes:  vision grossly intact, pupils equal, pupils round, and pupils reactive to light.   Ears:  R ear normal and L ear normal.   Nose:  no external deformity.   Mouth:  good dentition.   Neck:  No deformities, masses, or tenderness noted. Lungs:  Normal respiratory effort, chest expands symmetrically.  Scattered exp wheezes Heart:  Normal rate and regular rhythm. S1 and S2 normal without gallop, murmur, click, rub or other extra sounds. Abdomen:  Bowel sounds positive,abdomen soft and non-tender without masses, organomegaly or hernias noted. Msk:  No deformity or scoliosis noted of thoracic or lumbar spine.   Extremities:  No clubbing, cyanosis, edema, or deformity noted with normal full range of motion of all joints.   Neurologic:  alert & oriented X3 and gait normal.   Skin:  Intact without suspicious lesions or rashes Psych:  Cognition and judgment appear intact. Alert and cooperative with normal attention span and concentration. No apparent delusions, illusions, hallucinations GYN/breast- deferred, saw OBGYN in 04/2015       Assessment & Plan:

## 2015-06-09 NOTE — Progress Notes (Signed)
Pre visit review using our clinic review tool, if applicable. No additional management support is needed unless otherwise documented below in the visit note. 

## 2015-06-09 NOTE — Assessment & Plan Note (Addendum)
Reviewed preventive care protocols, scheduled due services, and updated immunizations Discussed nutrition, exercise, diet, and healthy lifestyle.  Flu shot today Orders Placed This Encounter  Procedures  . CBC with Differential/Platelet  . Comprehensive metabolic panel  . Lipid panel  . TSH

## 2015-06-09 NOTE — Patient Instructions (Signed)
Great to see you.  We will call you with your lab results. 

## 2015-06-10 ENCOUNTER — Encounter: Payer: Self-pay | Admitting: *Deleted

## 2015-06-18 ENCOUNTER — Telehealth: Payer: Self-pay | Admitting: Family Medicine

## 2015-06-18 DIAGNOSIS — R21 Rash and other nonspecific skin eruption: Secondary | ICD-10-CM

## 2015-06-18 NOTE — Telephone Encounter (Signed)
I would like for her to see dermatology at this point as I am not sure exactly what this rash is.  We need the help of a specialist.  Referral placed.

## 2015-06-18 NOTE — Telephone Encounter (Signed)
Pt states the medication that was prescribed for her face 9 days ago is not working.  Her face is still itching really bad and the bumps are still there.  She states Dr. Deborra Medina called these boils.  She wants to know if there is anything else she can try before she has to go to a dermatologist.

## 2015-06-19 NOTE — Telephone Encounter (Signed)
Lm on pts vm requesting a call back 

## 2015-06-22 NOTE — Telephone Encounter (Signed)
Spoke to pt and advised per Dr Aron; advised to await a call with appt details 

## 2015-06-22 NOTE — Telephone Encounter (Signed)
Lm on pts vm requesting a call back 

## 2015-07-07 ENCOUNTER — Other Ambulatory Visit: Payer: Self-pay | Admitting: Family Medicine

## 2015-08-15 ENCOUNTER — Other Ambulatory Visit: Payer: Self-pay | Admitting: Family Medicine

## 2016-03-28 ENCOUNTER — Other Ambulatory Visit: Payer: Self-pay | Admitting: *Deleted

## 2016-03-28 MED ORDER — LISINOPRIL-HYDROCHLOROTHIAZIDE 20-25 MG PO TABS: ORAL_TABLET | ORAL | 0 refills | Status: DC

## 2016-03-28 NOTE — Telephone Encounter (Signed)
Pt has recently changed pharmacies. Rx sent as requested

## 2016-04-18 ENCOUNTER — Telehealth: Payer: Self-pay | Admitting: Family Medicine

## 2016-04-18 NOTE — Telephone Encounter (Signed)
[facility] Patient Name: Michele Gilmore DOB: 1971/08/28 Initial Comment Caller states her hemorrhoids have been bleeding and burning. Nurse Assessment Nurse: Dimas Chyle, RN, Dellis Filbert Date/Time Eilene Ghazi Time): 04/18/2016 2:58:03 PM Confirm and document reason for call. If symptomatic, describe symptoms. You must click the next button to save text entered. ---Caller states her hemorrhoids have been bleeding and burning. Has had symptoms for 2 weeks. No fever. Has the patient traveled out of the country within the last 30 days? ---No Does the patient have any new or worsening symptoms? ---Yes Will a triage be completed? ---Yes Related visit to physician within the last 2 weeks? ---No Does the PT have any chronic conditions? (i.e. diabetes, asthma, etc.) ---Yes List chronic conditions. ---HTN Is the patient pregnant or possibly pregnant? (Ask all females between the ages of 74-55) ---No Is this a behavioral health or substance abuse call? ---No Guidelines Guideline Title Affirmed Question Affirmed Notes Rectal Bleeding MILD rectal bleeding (more than just a few drops or streaks) Final Disposition User See PCP When Office is Open (within 3 days) Dimas Chyle, RN, [employer] Referrals REFERRED TO PCP OFFICE Disagree/Comply: Leta Baptist

## 2016-04-19 ENCOUNTER — Ambulatory Visit: Payer: Self-pay | Admitting: Family Medicine

## 2016-04-19 DIAGNOSIS — Z0289 Encounter for other administrative examinations: Secondary | ICD-10-CM

## 2016-04-20 ENCOUNTER — Telehealth: Payer: Self-pay | Admitting: Family Medicine

## 2016-04-20 NOTE — Telephone Encounter (Signed)
error 

## 2016-04-22 ENCOUNTER — Encounter: Payer: Self-pay | Admitting: Family Medicine

## 2016-04-22 ENCOUNTER — Ambulatory Visit (INDEPENDENT_AMBULATORY_CARE_PROVIDER_SITE_OTHER): Payer: 59 | Admitting: Family Medicine

## 2016-04-22 DIAGNOSIS — K649 Unspecified hemorrhoids: Secondary | ICD-10-CM | POA: Diagnosis not present

## 2016-04-22 MED ORDER — HYDROCORTISONE ACETATE 25 MG RE SUPP: 25.0000 mg | Freq: Two times a day (BID) | RECTAL | 0 refills | Status: DC

## 2016-04-22 MED ORDER — HYDROCORTISONE 2.5 % RE CREA: 1.0000 "application " | TOPICAL_CREAM | Freq: Two times a day (BID) | RECTAL | 0 refills | Status: DC

## 2016-04-22 MED ORDER — DOCUSATE SODIUM 100 MG PO CAPS: 100.0000 mg | ORAL_CAPSULE | Freq: Two times a day (BID) | ORAL | Status: DC

## 2016-04-22 NOTE — Patient Instructions (Signed)
Use the cream externally for now and then use the suppositories if you continue to have bleeding afterward.  I would start taking colace 100mg  a day, stool softener.  Take care.  Glad to see you.

## 2016-04-22 NOTE — Progress Notes (Signed)
Pre visit review using our clinic review tool, if applicable. No additional management support is needed unless otherwise documented below in the visit note. 

## 2016-04-22 NOTE — Progress Notes (Signed)
Hemorrhoids.  Recently flare up.  More pain and pressure.  No vaginal discharge.  Rectal aching and bleeding, about 2 days ago.  No FCNAVD.  Constipated recently.  Last BM was this AM, not hard.  H/o hard stools.  Has tried prep H with minimal relief.  Had to use rx suppositories in the past.  No h/o hemorrhoid surgery.    Meds, vitals, and allergies reviewed.   ROS: Per HPI unless specifically indicated in ROS section   nad ncat rrr ctab Chaperoned exam with mult ext hemorrhoids noted, no fissure see.  No gross blood.  No hemorrhoids acutely thrombosed.

## 2016-04-25 NOTE — Assessment & Plan Note (Signed)
Use anusol cream externally for now and then use anusol suppositories if continued bleeding afterward.  Start taking colace 100mg  a day.  F/u prn.  She agrees.

## 2016-05-07 ENCOUNTER — Other Ambulatory Visit: Payer: Self-pay | Admitting: Family Medicine

## 2016-06-09 ENCOUNTER — Encounter: Payer: 59 | Admitting: Family Medicine

## 2016-06-20 ENCOUNTER — Encounter: Payer: 59 | Admitting: Family Medicine

## 2016-07-11 ENCOUNTER — Ambulatory Visit (INDEPENDENT_AMBULATORY_CARE_PROVIDER_SITE_OTHER): Payer: 59 | Admitting: Family Medicine

## 2016-07-11 ENCOUNTER — Encounter: Payer: Self-pay | Admitting: Family Medicine

## 2016-07-11 VITALS — BP 118/78 | HR 76 | Temp 98.1°F | Ht 65.0 in | Wt 157.5 lb

## 2016-07-11 DIAGNOSIS — Z202 Contact with and (suspected) exposure to infections with a predominantly sexual mode of transmission: Secondary | ICD-10-CM | POA: Diagnosis not present

## 2016-07-11 DIAGNOSIS — Z0000 Encounter for general adult medical examination without abnormal findings: Secondary | ICD-10-CM

## 2016-07-11 DIAGNOSIS — I10 Essential (primary) hypertension: Secondary | ICD-10-CM | POA: Diagnosis not present

## 2016-07-11 MED ORDER — LISINOPRIL-HYDROCHLOROTHIAZIDE 20-25 MG PO TABS: 1.0000 | ORAL_TABLET | Freq: Every day | ORAL | 3 refills | Status: DC

## 2016-07-11 NOTE — Progress Notes (Signed)
Pre visit review using our clinic review tool, if applicable. No additional management support is needed unless otherwise documented below in the visit note. 

## 2016-07-11 NOTE — Assessment & Plan Note (Signed)
Reviewed preventive care protocols, scheduled due services, and updated immunizations Discussed nutrition, exercise, diet, and healthy lifestyle.  Influenza vaccine given

## 2016-07-11 NOTE — Patient Instructions (Signed)
Great to see you. Please call your insurance company.  We will call you with your lab results.

## 2016-07-11 NOTE — Addendum Note (Signed)
Addended by: Ellamae Sia on: 07/11/2016 03:52 PM   Modules accepted: Orders

## 2016-07-11 NOTE — Progress Notes (Signed)
Subjective:   Patient ID: Michele Gilmore, female    DOB: 1970/09/20, 45 y.o.   MRN: [id number]  Michele Gilmore is a pleasant 45 y.o. year old female who presents to clinic today with Annual Exam  and follow up of chronic medical conditions on 07/11/2016  HPI:  Has GYN- last saw Dr. Jodi Mourning in 04/2015. S/p TAH in 2013 due to fibroids Due for mammogram  HTN- Compliant with lisinopril- HCTZ 20-25 mg daily. Denies HA, blurred vision, CP or SOB.  Hemorrhoids have resolved.  Lab Results  Component Value Date   CREATININE 0.87 06/09/2015   Lab Results  Component Value Date   CHOL 142 06/09/2015   HDL 64.90 06/09/2015   LDLCALC 58 06/09/2015   TRIG 92.0 06/09/2015   CHOLHDL 2 06/09/2015   Lab Results  Component Value Date   NA 138 06/09/2015   K 3.6 06/09/2015   CL 105 06/09/2015   CO2 27 06/09/2015     Current Outpatient Prescriptions on File Prior to Visit  Medication Sig Dispense Refill  . docusate sodium (COLACE) 100 MG capsule Take 1 capsule (100 mg total) by mouth 2 (two) times daily.    . hydrocortisone (ANUSOL-HC) 2.5 % rectal cream Place 1 application rectally 2 (two) times daily. 30 g 0  . ibuprofen (ADVIL,MOTRIN) 800 MG tablet Take 1 tablet (800 mg total) by mouth every 8 (eight) hours as needed. 30 tablet 5  . lisinopril-hydrochlorothiazide (PRINZIDE,ZESTORETIC) 20-25 MG tablet Take 1 tablet by mouth daily. COMPLETE PHYSICAL EXAM REQUIRED FOR ADDITIONAL REFILLS 90 tablet 0  . meloxicam (MOBIC) 15 MG tablet TAKE 1 TABLET BY MOUTH DAILY 30 tablet 5  . Omega-3 Fatty Acids (FISH OIL) 1200 MG CAPS Take one by mouth daily     No current facility-administered medications on file prior to visit.     No Known Allergies  Past Medical History:  Diagnosis Date  . Anemia   . Cataract   . Fibroids   . Heart murmur    as child, no problem  . Hypertension     Past Surgical History:  Procedure Laterality Date  . ABDOMINAL HYSTERECTOMY  10/21/2011   Procedure:  HYSTERECTOMY ABDOMINAL;  Surgeon: Shelly Bombard, MD;  Location: [facility];  Service: Gynecology;  Laterality: N/A;  Right oophorectomy  . EYE SURGERY  2013   [facility]   . SVD     x 3  . TUBAL LIGATION  06/2001  . WISDOM TOOTH EXTRACTION      Family History  Problem Relation Age of Onset  . Hypertension Mother   . Stroke Mother   . Hypertension Father     Social History   Social History  . Marital status: Single    Spouse name: N/A  . Number of children: N/A  . Years of education: N/A   Occupational History  . Not on file.   Social History Main Topics  . Smoking status: Never Smoker  . Smokeless tobacco: Never Used  . Alcohol use 0.0 oz/week     Comment: socially  . Drug use: No  . Sexual activity: Yes    Birth control/ protection: Surgical     Comment: hysterectomy    Other Topics Concern  . Not on file   Social History Narrative  . No narrative on file   The PMH, PSH, Social History, Family History, Medications, and allergies have been reviewed in [facility], and have been updated if relevant.   Review of Systems  Constitutional: Negative.   HENT: Negative.   Eyes: Negative.   Respiratory: Negative.   Cardiovascular: Negative.   Gastrointestinal: Negative.   Endocrine: Negative.   Genitourinary: Negative.   Musculoskeletal: Negative.   Allergic/Immunologic: Negative.   Neurological: Negative.   Hematological: Negative.   Psychiatric/Behavioral: Negative.   All other systems reviewed and are negative.      Objective:    BP 118/78   Pulse 76   Temp 98.1 F (36.7 C) (Oral)   Ht 5\' 5"  (1.651 m)   Wt 157 lb 8 oz (71.4 kg)   LMP 10/14/2011   BMI 26.21 kg/m    Physical Exam  Constitutional: She is oriented to person, place, and time. She appears well-developed and well-nourished. No distress.  HENT:  Head: Normocephalic and atraumatic.  Eyes: Conjunctivae are normal.  Neck: Normal range of motion. Neck supple.  Cardiovascular: Normal  rate, regular rhythm and normal heart sounds.   Pulmonary/Chest: Effort normal and breath sounds normal. No respiratory distress. She has no wheezes.  Abdominal: Soft. Bowel sounds are normal. She exhibits no distension. There is no tenderness. There is no rebound and no guarding.  Musculoskeletal: Normal range of motion. She exhibits no edema or tenderness.  Neurological: She is alert and oriented to person, place, and time. No cranial nerve deficit.  Skin: Skin is dry. She is not diaphoretic.  Psychiatric: She has a normal mood and affect. Her behavior is normal. Judgment and thought content normal.  Nursing note and vitals reviewed.         Assessment & Plan:   Routine general medical examination at a health care facility - Plan: CBC with Differential/Platelet, Comprehensive metabolic panel, Lipid panel, TSH  Essential hypertension No Follow-up on file.

## 2016-07-11 NOTE — Assessment & Plan Note (Signed)
Well controlled on current rx. No changes made. eRx refills sent.

## 2016-07-12 LAB — COMPREHENSIVE METABOLIC PANEL
ALT: 19 U/L (ref 0–35)
AST: 21 U/L (ref 0–37)
Albumin: 4.3 g/dL (ref 3.5–5.2)
Alkaline Phosphatase: 55 U/L (ref 39–117)
BUN: 11 mg/dL (ref 6–23)
CHLORIDE: 103 meq/L (ref 96–112)
CO2: 26 mEq/L (ref 19–32)
Calcium: 9.9 mg/dL (ref 8.4–10.5)
Creatinine, Ser: 1.1 mg/dL (ref 0.40–1.20)
GFR: 69.52 mL/min (ref >60.00)
GLUCOSE: 78 mg/dL (ref 70–99)
POTASSIUM: 3.5 meq/L (ref 3.5–5.1)
SODIUM: 138 meq/L (ref 135–145)
Total Bilirubin: 0.5 mg/dL (ref 0.2–1.2)
Total Protein: 7.5 g/dL (ref 6.0–8.3)

## 2016-07-12 LAB — CBC WITH DIFFERENTIAL/PLATELET
BASOS PCT: 0.5 % (ref 0.0–3.0)
Basophils Absolute: 0 10*3/uL (ref 0.0–0.1)
EOS ABS: 0.1 10*3/uL (ref 0.0–0.7)
EOS PCT: 2.4 % (ref 0.0–5.0)
HCT: 39.3 % (ref 36.0–46.0)
Hemoglobin: 13 g/dL (ref 12.0–15.0)
LYMPHS ABS: 1.5 10*3/uL (ref 0.7–4.0)
Lymphocytes Relative: 33.7 % (ref 12.0–46.0)
MCHC: 33.1 g/dL (ref 30.0–36.0)
MCV: 84.3 fl (ref 78.0–100.0)
MONO ABS: 0.3 10*3/uL (ref 0.1–1.0)
Monocytes Relative: 6 % (ref 3.0–12.0)
NEUTROS PCT: 57.4 % (ref 43.0–77.0)
Neutro Abs: 2.5 10*3/uL (ref 1.4–7.7)
Platelets: 200 10*3/uL (ref 150.0–400.0)
RBC: 4.66 Mil/uL (ref 3.87–5.11)
RDW: 14.5 % (ref 11.5–15.5)
WBC: 4.3 10*3/uL (ref 4.0–10.5)

## 2016-07-12 LAB — HEPATITIS PANEL, ACUTE
HCV Ab: NEGATIVE
Hep A IgM: NONREACTIVE
Hep B C IgM: NONREACTIVE
Hepatitis B Surface Ag: NEGATIVE

## 2016-07-12 LAB — LIPID PANEL
CHOL/HDL RATIO: 2
Cholesterol: 148 mg/dL (ref 0–200)
HDL: 69.9 mg/dL (ref >39.00)
LDL CALC: 67 mg/dL (ref 0–99)
NONHDL: 77.95
Triglycerides: 54 mg/dL (ref 0.0–149.0)
VLDL: 10.8 mg/dL (ref 0.0–40.0)

## 2016-07-12 LAB — HIV ANTIBODY (ROUTINE TESTING W REFLEX): HIV 1&2 Ab, 4th Generation: NONREACTIVE

## 2016-07-12 LAB — HEPATITIS C ANTIBODY: HCV Ab: NEGATIVE

## 2016-07-12 LAB — TSH: TSH: 3.94 u[IU]/mL (ref 0.35–4.50)

## 2016-07-12 LAB — GC/CHLAMYDIA PROBE AMP
CT PROBE, AMP APTIMA: NOT DETECTED
GC Probe RNA: NOT DETECTED

## 2016-07-12 LAB — RPR

## 2016-07-13 ENCOUNTER — Encounter: Payer: Self-pay | Admitting: *Deleted

## 2016-07-17 ENCOUNTER — Other Ambulatory Visit: Payer: Self-pay | Admitting: Family Medicine

## 2016-09-01 ENCOUNTER — Other Ambulatory Visit: Payer: Self-pay | Admitting: Obstetrics

## 2016-09-01 DIAGNOSIS — R102 Pelvic and perineal pain: Secondary | ICD-10-CM

## 2016-09-14 DIAGNOSIS — Z947 Corneal transplant status: Secondary | ICD-10-CM | POA: Diagnosis not present

## 2016-09-14 DIAGNOSIS — Z961 Presence of intraocular lens: Secondary | ICD-10-CM | POA: Diagnosis not present

## 2016-09-14 DIAGNOSIS — T86840 Corneal transplant rejection: Secondary | ICD-10-CM | POA: Diagnosis not present

## 2016-10-05 DIAGNOSIS — Z961 Presence of intraocular lens: Secondary | ICD-10-CM | POA: Diagnosis not present

## 2016-10-05 DIAGNOSIS — T86840 Corneal transplant rejection: Secondary | ICD-10-CM | POA: Diagnosis not present

## 2016-10-05 DIAGNOSIS — Z947 Corneal transplant status: Secondary | ICD-10-CM | POA: Diagnosis not present

## 2016-10-19 ENCOUNTER — Other Ambulatory Visit: Payer: Self-pay | Admitting: *Deleted

## 2016-10-19 MED ORDER — MELOXICAM 15 MG PO TABS: 15.0000 mg | ORAL_TABLET | Freq: Every day | ORAL | 2 refills | Status: DC

## 2016-10-19 NOTE — Telephone Encounter (Signed)
Pt changed to mail order pharmacy

## 2016-11-30 ENCOUNTER — Other Ambulatory Visit: Payer: Self-pay | Admitting: Family Medicine

## 2016-11-30 NOTE — Telephone Encounter (Signed)
Refilled/send to the pharmacy 

## 2016-12-13 ENCOUNTER — Ambulatory Visit (INDEPENDENT_AMBULATORY_CARE_PROVIDER_SITE_OTHER): Payer: 59 | Admitting: Obstetrics

## 2016-12-13 ENCOUNTER — Encounter: Payer: Self-pay | Admitting: Obstetrics

## 2016-12-13 ENCOUNTER — Other Ambulatory Visit: Payer: Self-pay | Admitting: Obstetrics

## 2016-12-13 VITALS — BP 123/68 | HR 72 | Temp 97.1°F | Wt 155.0 lb

## 2016-12-13 DIAGNOSIS — Z1231 Encounter for screening mammogram for malignant neoplasm of breast: Secondary | ICD-10-CM

## 2016-12-13 DIAGNOSIS — Z01419 Encounter for gynecological examination (general) (routine) without abnormal findings: Secondary | ICD-10-CM | POA: Diagnosis not present

## 2016-12-13 DIAGNOSIS — Z113 Encounter for screening for infections with a predominantly sexual mode of transmission: Secondary | ICD-10-CM

## 2016-12-13 DIAGNOSIS — N898 Other specified noninflammatory disorders of vagina: Secondary | ICD-10-CM

## 2016-12-13 DIAGNOSIS — K625 Hemorrhage of anus and rectum: Secondary | ICD-10-CM

## 2016-12-13 DIAGNOSIS — Z131 Encounter for screening for diabetes mellitus: Secondary | ICD-10-CM

## 2016-12-13 NOTE — Progress Notes (Signed)
Patient presents for Annual Exam. 

## 2016-12-13 NOTE — Progress Notes (Signed)
Subjective:        Michele Gilmore is a 46 y.o. female here for a routine exam.  Current complaints: Rectal bleeding ~ twice a week.  No rectal pain.  Denies constipation or diarrhea.   Personal health questionnaire:  Is patient Ashkenazi Jewish, have a family history of breast and/or ovarian cancer: no Is there a family history of uterine cancer diagnosed at age < 64, gastrointestinal cancer, urinary tract cancer, family member who is a Field seismologist syndrome-associated carrier: no Is the patient overweight and hypertensive, family history of diabetes, personal history of gestational diabetes, preeclampsia or PCOS: no Is patient over 25, have PCOS,  family history of premature CHD under age 65, diabetes, smoke, have hypertension or peripheral artery disease:  no At any time, has a partner hit, kicked or otherwise hurt or frightened you?: no Over the past 2 weeks, have you felt down, depressed or hopeless?: no Over the past 2 weeks, have you felt little interest or pleasure in doing things?:no   Gynecologic History Patient's last menstrual period was 10/14/2011. Contraception: status post hysterectomy Last Pap: 2013. Results were: normal Last mammogram: 2015. Results were: normal  Obstetric History OB History  Gravida Para Term Preterm AB Living  5 3 3  0 2 3  SAB TAB Ectopic Multiple Live Births  2 0 0 0 3    # Outcome Date GA Lbr Len/2nd Weight Sex Delivery Anes PTL Lv  5 Term 05/09/01 [redacted]w[redacted]d  7 lb 12 oz (3.515 kg) M Vag-Spont None  LIV  4 SAB 2000 [redacted]w[redacted]d       DEC  3 Term 08/20/92 [redacted]w[redacted]d  7 lb 14 oz (3.572 kg) F Vag-Spont None  LIV  2 SAB 1992 [redacted]w[redacted]d       DEC  1 Term 01/28/89   7 lb 5 oz (3.317 kg) M Vag-Spont None  LIV      Past Medical History:  Diagnosis Date  . Anemia   . Cataract   . Fibroids   . Heart murmur    as child, no problem  . Hypertension     Past Surgical History:  Procedure Laterality Date  . ABDOMINAL HYSTERECTOMY  10/21/2011   Procedure: HYSTERECTOMY  ABDOMINAL;  Surgeon: Shelly Bombard, MD;  Location: [facility];  Service: Gynecology;  Laterality: N/A;  Right oophorectomy  . EYE SURGERY  2013   [facility]   . SVD     x 3  . TUBAL LIGATION  06/2001  . WISDOM TOOTH EXTRACTION       Current Outpatient Prescriptions:  .  docusate sodium (COLACE) 100 MG capsule, Take 1 capsule (100 mg total) by mouth 2 (two) times daily. (Patient not taking: Reported on 12/13/2016), Disp: , Rfl:  .  hydrocortisone (ANUSOL-HC) 2.5 % rectal cream, Place 1 application rectally 2 (two) times daily., Disp: 30 g, Rfl: 0 .  ibuprofen (ADVIL,MOTRIN) 800 MG tablet, TAKE 1 TABLET(800 MG) BY MOUTH EVERY 8 HOURS AS NEEDED (Patient not taking: Reported on 12/13/2016), Disp: 30 tablet, Rfl: 0 .  lisinopril-hydrochlorothiazide (PRINZIDE,ZESTORETIC) 20-25 MG tablet, TAKE 1 TABLET BY MOUTH  DAILY, Disp: 90 tablet, Rfl: 0 .  meloxicam (MOBIC) 15 MG tablet, Take 1 tablet (15 mg total) by mouth daily., Disp: 90 tablet, Rfl: 2 .  Omega-3 Fatty Acids (FISH OIL) 1200 MG CAPS, Take one by mouth daily, Disp: , Rfl:  No Known Allergies  Social History  Substance Use Topics  . Smoking status: Never Smoker  . Smokeless  tobacco: Never Used  . Alcohol use 0.0 oz/week     Comment: socially    Family History  Problem Relation Age of Onset  . Hypertension Mother   . Stroke Mother   . Hypertension Father       Review of Systems  Constitutional: negative for fatigue and weight loss Respiratory: negative for cough and wheezing Cardiovascular: negative for chest pain, fatigue and palpitations Gastrointestinal: negative for abdominal pain.  Positive for rectal bleeding Musculoskeletal:negative for myalgias Neurological: negative for gait problems and tremors Behavioral/Psych: negative for abusive relationship, depression Endocrine: negative for temperature intolerance    Genitourinary:negative for abnormal menstrual periods, genital lesions, hot flashes, sexual problems and  vaginal discharge Integument/breast: negative for breast lump, breast tenderness, nipple discharge and skin lesion(s)    Objective:       BP 123/68   Pulse 72   Temp 97.1 F (36.2 C)   Wt 155 lb (70.3 kg)   LMP 10/14/2011   BMI 25.79 kg/m  General:   alert  Skin:   no rash or abnormalities  Lungs:   clear to auscultation bilaterally  Heart:   regular rate and rhythm, S1, S2 normal, no murmur, click, rub or gallop  Breasts:   normal without suspicious masses, skin or nipple changes or axillary nodes  Abdomen:  normal findings: no organomegaly, soft, non-tender and no hernia  Pelvis:  External genitalia: normal general appearance Urinary system: urethral meatus normal and bladder without fullness, nontender Vaginal: normal without tenderness, induration or masses Cervix: absent Adnexa: normal bimanual exam Uterus: absent                             Rectal exam:  No external hemorrhoids  Lab Review Urine pregnancy test Labs reviewed yes Radiologic studies reviewed yes  50% of 20 min visit spent on counseling and coordination of care.    Assessment:    Healthy female exam.    Rectal bleeding.  Possible Hemorrhoids, bleeding ~ twice a week, no pain   Plan:   Referred to Gastroenterologist for evaluation of rectal bleeding, possible hemorrhoids    Education reviewed: calcium supplements, depression evaluation, low fat, low cholesterol diet, safe sex/STD prevention, self breast exams and weight bearing exercise. Follow up in: 1 year.   No orders of the defined types were placed in this encounter.  Orders Placed This Encounter  Procedures  . Ambulatory referral to Gastroenterology    Referral Priority:   Routine    Referral Type:   Consultation    Referral Reason:   Specialty Services Required    Number of Visits Requested:   1     Patient ID: Michele Gilmore, female   DOB: 1971/06/22, 46 y.o.   MRN: [id number]

## 2016-12-14 ENCOUNTER — Encounter: Payer: Self-pay | Admitting: Gastroenterology

## 2016-12-14 ENCOUNTER — Other Ambulatory Visit: Payer: Self-pay | Admitting: Obstetrics

## 2016-12-14 DIAGNOSIS — T86840 Corneal transplant rejection: Secondary | ICD-10-CM | POA: Diagnosis not present

## 2016-12-14 DIAGNOSIS — Z947 Corneal transplant status: Secondary | ICD-10-CM | POA: Diagnosis not present

## 2016-12-14 DIAGNOSIS — B373 Candidiasis of vulva and vagina: Secondary | ICD-10-CM

## 2016-12-14 DIAGNOSIS — Z961 Presence of intraocular lens: Secondary | ICD-10-CM | POA: Diagnosis not present

## 2016-12-14 DIAGNOSIS — B3731 Acute candidiasis of vulva and vagina: Secondary | ICD-10-CM

## 2016-12-14 LAB — CERVICOVAGINAL ANCILLARY ONLY
BACTERIAL VAGINITIS: NEGATIVE
Candida vaginitis: POSITIVE — AB
TRICH (WINDOWPATH): NEGATIVE

## 2016-12-14 MED ORDER — TERCONAZOLE 0.8 % VA CREA: 1.0000 | TOPICAL_CREAM | Freq: Every day | VAGINAL | 0 refills | Status: DC

## 2016-12-30 ENCOUNTER — Ambulatory Visit
Admission: RE | Admit: 2016-12-30 | Discharge: 2016-12-30 | Disposition: A | Discharge status: Home / Self Care | Payer: 59 | Source: Ambulatory Visit | Attending: Obstetrics | Admitting: Obstetrics

## 2016-12-30 DIAGNOSIS — Z1231 Encounter for screening mammogram for malignant neoplasm of breast: Secondary | ICD-10-CM

## 2017-01-05 ENCOUNTER — Ambulatory Visit: Payer: 59

## 2017-01-17 ENCOUNTER — Ambulatory Visit: Payer: 59 | Admitting: Gastroenterology

## 2017-01-17 ENCOUNTER — Telehealth: Payer: Self-pay | Admitting: Gastroenterology

## 2017-01-17 NOTE — Telephone Encounter (Signed)
No, do not charge fee. Thank you.

## 2017-02-21 ENCOUNTER — Ambulatory Visit: Payer: 59 | Admitting: Gastroenterology

## 2017-04-04 ENCOUNTER — Ambulatory Visit: Payer: 59 | Admitting: Gastroenterology

## 2017-05-09 ENCOUNTER — Other Ambulatory Visit: Payer: Self-pay | Admitting: Family Medicine

## 2017-06-05 ENCOUNTER — Encounter: Payer: Self-pay | Admitting: Obstetrics

## 2017-06-06 ENCOUNTER — Ambulatory Visit: Payer: 59 | Admitting: Gastroenterology

## 2017-06-19 ENCOUNTER — Encounter: Payer: Self-pay | Admitting: Internal Medicine

## 2017-06-19 ENCOUNTER — Ambulatory Visit (INDEPENDENT_AMBULATORY_CARE_PROVIDER_SITE_OTHER): Payer: 59 | Admitting: Internal Medicine

## 2017-06-19 VITALS — BP 118/80 | HR 68 | Temp 97.9°F | Wt 149.0 lb

## 2017-06-19 DIAGNOSIS — Z23 Encounter for immunization: Secondary | ICD-10-CM | POA: Diagnosis not present

## 2017-06-19 DIAGNOSIS — I10 Essential (primary) hypertension: Secondary | ICD-10-CM

## 2017-06-19 NOTE — Assessment & Plan Note (Addendum)
Well controlled on Lisinopril HCT Will monitor  Make an appt for your annual exam

## 2017-06-19 NOTE — Patient Instructions (Signed)

## 2017-06-19 NOTE — Addendum Note (Signed)
Addended by: Lurlean Nanny on: 06/19/2017 04:50 PM   Modules accepted: Orders

## 2017-06-19 NOTE — Progress Notes (Signed)
HPI  Pt presents to the clinic today to establish care and for management of the conditions listed below. She is transferring care from Dr. Deborra Medina.  HTN: Her BP today is 118/80. She is taking Lisinopril HCT every 2 days as prescribed. ECG from reviewed.  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 She takes Meloxicam as needed with good relief.   Flu: 07/2016 Tetanus: unsure Pap Smear: 12/2016, partial hysterectomy Mammogram: 12/2016 Vision Screening: annually Dentist: biannually   Past Medical History:  Diagnosis Date  . Anemia   . Cataract   . Fibroids   . Heart murmur    as child, no problem  . Hypertension     Current Outpatient Prescriptions  Medication Sig Dispense Refill  . docusate sodium (COLACE) 100 MG capsule Take 1 capsule (100 mg total) by mouth 2 (two) times daily. (Patient not taking: Reported on 12/13/2016)    . hydrocortisone (ANUSOL-HC) 2.5 % rectal cream Place 1 application rectally 2 (two) times daily. 30 g 0  . ibuprofen (ADVIL,MOTRIN) 800 MG tablet TAKE 1 TABLET(800 MG) BY MOUTH EVERY 8 HOURS AS NEEDED (Patient not taking: Reported on 12/13/2016) 30 tablet 0  . lisinopril-hydrochlorothiazide (PRINZIDE,ZESTORETIC) 20-25 MG tablet TAKE 1 TABLET BY MOUTH  DAILY 90 tablet 0  . meloxicam (MOBIC) 15 MG tablet Take 1 tablet (15 mg total) by mouth daily. 90 tablet 2  . Omega-3 Fatty Acids (FISH OIL) 1200 MG CAPS Take one by mouth daily    . terconazole (TERAZOL 3) 0.8 % vaginal cream Place 1 applicator vaginally at bedtime. 20 g 0   No current facility-administered medications for this visit.     No Known Allergies  Family History  Problem Relation Age of Onset  . Hypertension Mother   . Stroke Mother   . Hypertension Father     Social History   Social History  . Marital status: Single    Spouse name: N/A  . Number of children: N/A  . Years of education: N/A   Occupational History  . Not on file.   Social History Main Topics  . Smoking status:  Never Smoker  . Smokeless tobacco: Never Used  . Alcohol use 0.0 oz/week     Comment: socially  . Drug use: No  . Sexual activity: Yes    Birth control/ protection: Surgical     Comment: hysterectomy    Other Topics Concern  . Not on file   Social History Narrative  . No narrative on file    ROS:  Constitutional: Denies fever, malaise, fatigue, headache or abrupt weight changes.  HEENT: Denies eye pain, eye redness, ear pain, ringing in the ears, wax buildup, runny nose, nasal congestion, bloody nose, or sore throat. Respiratory: Denies difficulty breathing, shortness of breath, cough or sputum production.   Cardiovascular: Denies chest pain, chest tightness, palpitations or swelling in the hands or feet.  Gastrointestinal: Denies abdominal pain, bloating, constipation, diarrhea or blood in the stool.  GU: Denies frequency, urgency, pain with urination, blood in urine, odor or discharge. Musculoskeletal: Denies decrease in range of motion, difficulty with gait, muscle pain or joint pain and swelling.  Skin: Denies redness, rashes, lesions or ulcercations.  Neurological: Denies dizziness, difficulty with memory, difficulty with speech or problems with balance and coordination.  Psych: Denies anxiety, depression, SI/HI.  No other specific complaints in a complete review of systems (except as listed in HPI above).  PE:  LMP 10/14/2011  Wt Readings from Last 3 Encounters:  12/13/16 155  lb (70.3 kg)  07/11/16 157 lb 8 oz (71.4 kg)  04/22/16 158 lb (71.7 kg)    General: Appears their stated age, well developed, well nourished in NAD. HEENT: Head: normal shape and size; Eyes: sclera white, no icterus, conjunctiva pink, PERRLA and EOMs intact; Ears: Tm's gray and intact, normal light reflex;Throat/Mouth: Teeth present, mucosa pink and moist, no lesions or ulcerations noted.  Neck: Neck supple, trachea midline. No masses, lumps or thyromegaly present.  Cardiovascular: Normal rate  and rhythm. S1,S2 noted.  No murmur, rubs or gallops noted. No JVD or BLE edema. No carotid bruits noted. Pulmonary/Chest: Normal effort and positive vesicular breath sounds. No respiratory distress. No wheezes, rales or ronchi noted.  Abdomen: Soft and nontender. Normal bowel sounds, no bruits noted. No distention or masses noted. Liver, spleen and kidneys non palpable. Musculoskeletal: Normal range of motion. Strength 5/5 BUE/BLE. No signs of joint swelling. No difficulty with gait.  Neurological: Alert and oriented. Cranial nerves II-XII grossly intact. Coordination normal.  Psychiatric: Mood and affect normal. Behavior is normal. Judgment and thought content normal.   EKG:  BMET    Component Value Date/Time   NA 138 07/11/2016 1544   K 3.5 07/11/2016 1544   CL 103 07/11/2016 1544   CO2 26 07/11/2016 1544   GLUCOSE 78 07/11/2016 1544   GLUCOSE 86 07/25/2006 1102   BUN 11 07/11/2016 1544   CREATININE 1.10 07/11/2016 1544   CALCIUM 9.9 07/11/2016 1544   GFRNONAA >90 10/22/2011 0512   GFRAA >90 10/22/2011 0512    Lipid Panel     Component Value Date/Time   CHOL 148 07/11/2016 1544   TRIG 54.0 07/11/2016 1544   TRIG 48 07/25/2006 1102   HDL 69.90 07/11/2016 1544   CHOLHDL 2 07/11/2016 1544   VLDL 10.8 07/11/2016 1544   LDLCALC 67 07/11/2016 1544    CBC    Component Value Date/Time   WBC 4.3 07/11/2016 1544   RBC 4.66 07/11/2016 1544   HGB 13.0 07/11/2016 1544   HCT 39.3 07/11/2016 1544   PLT 200.0 07/11/2016 1544   MCV 84.3 07/11/2016 1544   MCH 24.4 (L) 10/22/2011 1118   MCHC 33.1 07/11/2016 1544   RDW 14.5 07/11/2016 1544   LYMPHSABS 1.5 07/11/2016 1544   MONOABS 0.3 07/11/2016 1544   EOSABS 0.1 07/11/2016 1544   BASOSABS 0.0 07/11/2016 1544    Hgb A1C No results found for: HGBA1C   Assessment and Plan:

## 2017-09-07 ENCOUNTER — Ambulatory Visit (INDEPENDENT_AMBULATORY_CARE_PROVIDER_SITE_OTHER): Payer: 59 | Admitting: Internal Medicine

## 2017-09-07 ENCOUNTER — Encounter: Payer: Self-pay | Admitting: Internal Medicine

## 2017-09-07 VITALS — BP 112/70 | HR 81 | Temp 97.7°F | Ht 65.5 in | Wt 153.5 lb

## 2017-09-07 DIAGNOSIS — Z23 Encounter for immunization: Secondary | ICD-10-CM

## 2017-09-07 DIAGNOSIS — Z13220 Encounter for screening for lipoid disorders: Secondary | ICD-10-CM

## 2017-09-07 DIAGNOSIS — Z0000 Encounter for general adult medical examination without abnormal findings: Secondary | ICD-10-CM | POA: Diagnosis not present

## 2017-09-07 DIAGNOSIS — I10 Essential (primary) hypertension: Secondary | ICD-10-CM | POA: Diagnosis not present

## 2017-09-07 DIAGNOSIS — H9202 Otalgia, left ear: Secondary | ICD-10-CM

## 2017-09-07 LAB — LIPID PANEL
CHOL/HDL RATIO: 2
CHOLESTEROL: 156 mg/dL (ref 0–200)
HDL: 68.8 mg/dL (ref >39.00)
LDL CALC: 74 mg/dL (ref 0–99)
NONHDL: 86.77
Triglycerides: 63 mg/dL (ref 0.0–149.0)
VLDL: 12.6 mg/dL (ref 0.0–40.0)

## 2017-09-07 LAB — COMPREHENSIVE METABOLIC PANEL
ALT: 23 U/L (ref 0–35)
AST: 28 U/L (ref 0–37)
Albumin: 4.2 g/dL (ref 3.5–5.2)
Alkaline Phosphatase: 58 U/L (ref 39–117)
BUN: 15 mg/dL (ref 6–23)
CALCIUM: 9.8 mg/dL (ref 8.4–10.5)
CHLORIDE: 101 meq/L (ref 96–112)
CO2: 29 meq/L (ref 19–32)
Creatinine, Ser: 1 mg/dL (ref 0.40–1.20)
GFR: 77.19 mL/min (ref >60.00)
Glucose, Bld: 80 mg/dL (ref 70–99)
POTASSIUM: 3.6 meq/L (ref 3.5–5.1)
Sodium: 137 mEq/L (ref 135–145)
Total Bilirubin: 0.6 mg/dL (ref 0.2–1.2)
Total Protein: 7.3 g/dL (ref 6.0–8.3)

## 2017-09-07 LAB — CBC
HEMATOCRIT: 40.4 % (ref 36.0–46.0)
HEMOGLOBIN: 13.1 g/dL (ref 12.0–15.0)
MCHC: 32.5 g/dL (ref 30.0–36.0)
MCV: 86 fl (ref 78.0–100.0)
PLATELETS: 206 10*3/uL (ref 150.0–400.0)
RBC: 4.7 Mil/uL (ref 3.87–5.11)
RDW: 14.4 % (ref 11.5–15.5)
WBC: 4.7 10*3/uL (ref 4.0–10.5)

## 2017-09-07 NOTE — Progress Notes (Signed)
Subjective:    Patient ID: Michele Gilmore, female    DOB: 01-06-1971, 47 y.o.   MRN: [id number]  HPI  Pt presents to the clinic today for her annual exam. She is also due to follow up chronic conditions.  HTN: Her BP today is 112/70. She is taking Lisinopril HCT as prescribed. ECG from 10/2011 reviewed.  Flu: 06/2017 Tetanus: unsure Mammogram: 12/2016 Pap Smear: 12/2016, s/p hysterectomy Vision Screening: annually Dentist: biannually  Diet: She does eat lean meats. She consumes some fruits and veggies. She occassionally eats fried foods. She drinks mostly water. Exercise: Cardio for 1 hour, 4 days a week  Review of Systems      Past Medical History:  Diagnosis Date  . Cataract   . Heart murmur    as child, no problem  . Hypertension     Current Outpatient Medications  Medication Sig Dispense Refill  . cyanocobalamin ([facility] VITAMIN B12) 1000 MCG tablet Take 2,000 mcg by mouth daily.    Marland Kitchen lisinopril-hydrochlorothiazide (PRINZIDE,ZESTORETIC) 20-25 MG tablet TAKE 1 TABLET BY MOUTH  DAILY 90 tablet 0  . meloxicam (MOBIC) 15 MG tablet Take 1 tablet (15 mg total) by mouth daily. 90 tablet 2  . Omega-3 Fatty Acids (FISH OIL) 1200 MG CAPS Take one by mouth daily    . thiamine (VITAMIN B-1) 100 MG tablet Take 100 mg by mouth daily.     No current facility-administered medications for this visit.     No Known Allergies  Family History  Problem Relation Age of Onset  . Hypertension Mother   . Stroke Mother   . Hypertension Father     Social History   Socioeconomic History  . Marital status: Single    Spouse name: Not on file  . Number of children: Not on file  . Years of education: Not on file  . Highest education level: Not on file  Social Needs  . Financial resource strain: Not on file  . Food insecurity - worry: Not on file  . Food insecurity - inability: Not on file  . Transportation needs - medical: Not on file  . Transportation needs - non-medical: Not on  file  Occupational History  . Not on file  Tobacco Use  . Smoking status: Never Smoker  . Smokeless tobacco: Never Used  Substance and Sexual Activity  . Alcohol use: Yes    Alcohol/week: 0.0 oz    Comment: socially  . Drug use: No  . Sexual activity: Yes    Birth control/protection: Surgical    Comment: hysterectomy   Other Topics Concern  . Not on file  Social History Narrative  . Not on file     Constitutional: Denies fever, malaise, fatigue, headache or abrupt weight changes.  HEENT: Pt reports preauricular pain on the left. Denies eye pain, eye redness, ear pain, ringing in the ears, wax buildup, runny nose, nasal congestion, bloody nose, or sore throat. Respiratory: Denies difficulty breathing, shortness of breath, cough or sputum production.   Cardiovascular: Denies chest pain, chest tightness, palpitations or swelling in the hands or feet.  Gastrointestinal: Denies abdominal pain, bloating, constipation, diarrhea or blood in the stool.  GU: Denies urgency, frequency, pain with urination, burning sensation, blood in urine, odor or discharge. Musculoskeletal: Denies decrease in range of motion, difficulty with gait, muscle pain or joint pain and swelling.  Skin: Denies redness, rashes, lesions or ulcercations.  Neurological: Denies dizziness, difficulty with memory, difficulty with speech or problems with balance and  coordination.  Psych: Denies anxiety, depression, SI/HI.  No other specific complaints in a complete review of systems (except as listed in HPI above).  Objective:   Physical Exam   BP 112/70   Pulse 81   Temp 97.7 F (36.5 C) (Oral)   Ht 5' 5.5" (1.664 m)   Wt 153 lb 8 oz (69.6 kg)   LMP 10/14/2011   SpO2 98%   BMI 25.16 kg/m  Wt Readings from Last 3 Encounters:  09/07/17 153 lb 8 oz (69.6 kg)  06/19/17 149 lb (67.6 kg)  12/13/16 155 lb (70.3 kg)    General: Appears her stated age, well developed, well nourished in NAD. Skin: Warm, dry and  intact.  HEENT: Head: normal shape and size; Eyes: sclera white, no icterus, conjunctiva pink, PERRLA and EOMs intact; Ears: Tm's gray and intact, normal light reflex, no pain with palpation of the tragus; Throat/Mouth: Teeth decay noted, mucosa pink and moist, no exudate, lesions or ulcerations noted.  Neck:  Neck supple, trachea midline. No masses, lumps or thyromegaly present.  Cardiovascular: Normal rate and rhythm. S1,S2 noted.  No murmur, rubs or gallops noted. No JVD or BLE edema. No carotid bruits noted. Pulmonary/Chest: Normal effort and positive vesicular breath sounds. No respiratory distress. No wheezes, rales or ronchi noted.  Abdomen: Soft and nontender. Normal bowel sounds. No distention or masses noted. Liver, spleen and kidneys non palpable. Musculoskeletal: No pain with palpation of the TMJ. Strength 5/5 BUE/BLE. No difficulty with gait.  Neurological: Alert and oriented. Cranial nerves II-XII grossly intact. Coordination normal.  Psychiatric: Mood and affect normal. Behavior is normal. Judgment and thought content normal.    BMET    Component Value Date/Time   NA 138 07/11/2016 1544   K 3.5 07/11/2016 1544   CL 103 07/11/2016 1544   CO2 26 07/11/2016 1544   GLUCOSE 78 07/11/2016 1544   GLUCOSE 86 07/25/2006 1102   BUN 11 07/11/2016 1544   CREATININE 1.10 07/11/2016 1544   CALCIUM 9.9 07/11/2016 1544   GFRNONAA >90 10/22/2011 0512   GFRAA >90 10/22/2011 0512    Lipid Panel     Component Value Date/Time   CHOL 148 07/11/2016 1544   TRIG 54.0 07/11/2016 1544   TRIG 48 07/25/2006 1102   HDL 69.90 07/11/2016 1544   CHOLHDL 2 07/11/2016 1544   VLDL 10.8 07/11/2016 1544   LDLCALC 67 07/11/2016 1544    CBC    Component Value Date/Time   WBC 4.3 07/11/2016 1544   RBC 4.66 07/11/2016 1544   HGB 13.0 07/11/2016 1544   HCT 39.3 07/11/2016 1544   PLT 200.0 07/11/2016 1544   MCV 84.3 07/11/2016 1544   MCH 24.4 (L) 10/22/2011 1118   MCHC 33.1 07/11/2016 1544    RDW 14.5 07/11/2016 1544   LYMPHSABS 1.5 07/11/2016 1544   MONOABS 0.3 07/11/2016 1544   EOSABS 0.1 07/11/2016 1544   BASOSABS 0.0 07/11/2016 1544    Hgb A1C No results found for: HGBA1C         Assessment & Plan:   Preventative Health Maintenance:  Flu shot today Tdap today Pap smear and mammogram UTD Encouraged her to consume a balanced diet and exercise regimen Advised her to see an eye doctor and dentist annually Will check CBC, CMET and lipid profile  Preauricular Pain, Left:  Exam benign She would like to see ENT, referral placed  RTC in 1 year, sooner if needed Webb Silversmith, NP

## 2017-09-07 NOTE — Assessment & Plan Note (Signed)
Controlled on Lisinopril HCT Reinforced DASH diet CBC and CMET today

## 2017-09-07 NOTE — Patient Instructions (Signed)

## 2017-09-11 ENCOUNTER — Encounter: Payer: Self-pay | Admitting: *Deleted

## 2017-09-20 ENCOUNTER — Ambulatory Visit: Payer: 59 | Admitting: Gastroenterology

## 2017-09-26 ENCOUNTER — Other Ambulatory Visit: Payer: Self-pay | Admitting: Family Medicine

## 2017-09-28 NOTE — Telephone Encounter (Signed)
RB-Plz see refill requests/thx dmf

## 2017-11-02 ENCOUNTER — Ambulatory Visit: Payer: 59 | Admitting: Gastroenterology

## 2017-11-03 ENCOUNTER — Encounter: Payer: 59 | Admitting: Internal Medicine

## 2017-12-26 ENCOUNTER — Encounter: Payer: Self-pay | Admitting: Gastroenterology

## 2017-12-26 ENCOUNTER — Ambulatory Visit (INDEPENDENT_AMBULATORY_CARE_PROVIDER_SITE_OTHER): Payer: 59 | Admitting: Gastroenterology

## 2017-12-26 VITALS — BP 122/70 | HR 76 | Ht 66.5 in | Wt 160.1 lb

## 2017-12-26 DIAGNOSIS — K625 Hemorrhage of anus and rectum: Secondary | ICD-10-CM | POA: Diagnosis not present

## 2017-12-26 DIAGNOSIS — K5900 Constipation, unspecified: Secondary | ICD-10-CM

## 2017-12-26 DIAGNOSIS — K219 Gastro-esophageal reflux disease without esophagitis: Secondary | ICD-10-CM

## 2017-12-26 DIAGNOSIS — K648 Other hemorrhoids: Secondary | ICD-10-CM

## 2017-12-26 MED ORDER — POLYETHYLENE GLYCOL 3350 17 GM/SCOOP PO POWD: ORAL | 3 refills | Status: DC

## 2017-12-26 MED ORDER — OMEPRAZOLE 20 MG PO CPDR: 20.0000 mg | DELAYED_RELEASE_CAPSULE | Freq: Every day | ORAL | 3 refills | Status: DC

## 2017-12-26 MED ORDER — SUPREP BOWEL PREP KIT 17.5-3.13-1.6 GM/177ML PO SOLN: ORAL | 0 refills | Status: DC

## 2017-12-26 NOTE — Progress Notes (Signed)
HPI :  47 y/o [place] American female with a history of cataracts, hypertension, hemorrhoids referred here for persistent rectal bleeding, constipation, abdominal discomfort by Dr. Baltazar Najjar.   She reports one year worth of intermittent bright red blood per rectum. She states she can notice this for a few days at a time and then will go away for a few weeks, and then can recur sporadically. She reports when she sees the blood it can be significant bleeding at times. She does have some pain with bowel movements at times in her anal area. She averages 1 bowel movement every 2 days, she endorses occasional straining with her bowel movements. She states she developed hemorrhoids with her pregnancy and thinks that is what causing her bleeding. She has tried using hydrocortisone suppositories which has not helped too much. She endorses constipation which appears to be long-standing. She takes Dulcolax as needed and some over-the-counter laxatives occasionally. She is not tried MiraLAX. She does have some left sided lower abdominal discomfort and bloating at times. The pain comes and goes. She also notes pyrosis and belching that occurs occasionally, this is bothering her more recently. She denies any dysphagia. Symptoms can be worse with eating at times. She has taken some Tums which hasn't helped too much. No unintentional weight loss. She denies any family history of colon cancer. She has never had a prior colonoscopy. No history of anemia.    Past Medical History:  Diagnosis Date  . Cataract   . Heart murmur    as child, no problem  . Hypertension      Past Surgical History:  Procedure Laterality Date  . ABDOMINAL HYSTERECTOMY  10/21/2011   Procedure: HYSTERECTOMY ABDOMINAL;  Surgeon: Shelly Bombard, MD;  Location: [facility];  Service: Gynecology;  Laterality: N/A;  Right oophorectomy  . EYE SURGERY  2013   [facility]   . SVD     x 3  . TUBAL LIGATION  06/2001  . WISDOM TOOTH  EXTRACTION     Family History  Problem Relation Age of Onset  . Hypertension Mother   . Stroke Mother   . Hypertension Father   . Prostate cancer Maternal Grandfather   . Pancreatic cancer Maternal Uncle    Social History   Tobacco Use  . Smoking status: Never Smoker  . Smokeless tobacco: Never Used  Substance Use Topics  . Alcohol use: Yes    Alcohol/week: 0.0 oz    Comment: socially  . Drug use: No   Current Outpatient Medications  Medication Sig Dispense Refill  . cyanocobalamin ([facility] VITAMIN B12) 1000 MCG tablet Take 2,000 mcg by mouth daily.    Marland Kitchen lisinopril-hydrochlorothiazide (PRINZIDE,ZESTORETIC) 20-25 MG tablet TAKE 1 TABLET BY MOUTH  DAILY 90 tablet 0  . lisinopril-hydrochlorothiazide (PRINZIDE,ZESTORETIC) 20-25 MG tablet TAKE 1 TABLET BY MOUTH DAILY 90 tablet 0  . meloxicam (MOBIC) 15 MG tablet Take 1 tablet (15 mg total) by mouth daily. 90 tablet 2  . meloxicam (MOBIC) 15 MG tablet TAKE 1 TABLET BY MOUTH DAILY 90 tablet 0  . Omega-3 Fatty Acids (FISH OIL) 1200 MG CAPS Take one by mouth daily    . thiamine (VITAMIN B-1) 100 MG tablet Take 100 mg by mouth daily.     No current facility-administered medications for this visit.    No Known Allergies   Review of Systems: All systems reviewed and negative except where noted in HPI.   Lab Results  Component Value Date   WBC 4.7  09/07/2017   HGB 13.1 09/07/2017   HCT 40.4 09/07/2017   MCV 86.0 09/07/2017   PLT 206.0 09/07/2017    Lab Results  Component Value Date   CREATININE 1.00 09/07/2017   BUN 15 09/07/2017   NA 137 09/07/2017   K 3.6 09/07/2017   CL 101 09/07/2017   CO2 29 09/07/2017    Lab Results  Component Value Date   ALT 23 09/07/2017   AST 28 09/07/2017   ALKPHOS 58 09/07/2017   BILITOT 0.6 09/07/2017     Physical Exam: Ht 5' 6.5" (1.689 m)   Wt 160 lb 2 oz (72.6 kg)   LMP 10/14/2011   BMI 25.46 kg/m  Constitutional: Pleasant,well-developed, female in no acute distress. HEENT:  Normocephalic and atraumatic. Conjunctivae are normal. No scleral icterus. Neck supple.  Cardiovascular: Normal rate, regular rhythm.  Pulmonary/chest: Effort normal and breath sounds normal. No wheezing, rales or rhonchi. Abdominal: Soft, nondistended, nontender. . There are no masses palpable. No hepatomegaly. DRE / Anoscopy - no fissure, no mass lesions on DRE, (+) internal hemorrhoids, inflamed in RP position. Quanah present as standby Extremities: no edema Lymphadenopathy: No cervical adenopathy noted. Neurological: Alert and oriented to person place and time. Skin: Skin is warm and dry. No rashes noted. Psychiatric: Normal mood and affect. Behavior is normal.   ASSESSMENT AND PLAN: 47 year old female with chronic constipation in one year worth of intermittent rectal bleeding. She also has reflux symptoms which are bothering her recently.  Rectal bleeding / constipation / internal hemorrhoids - I suspect her bleeding symptoms are due to inflamed hemorrhoids in the setting of constipation, as noted on anoscopy. No evidence of fissure. That being said given her duration of rectal bleeding symptoms and her age, I'm recommending a colonoscopy to ensure no bleeding polyp or mass lesion as she is also due for colon cancer screening. In the interim recommend we treat her constipation with MiraLAX initially, she can take this once or twice daily and titrate up as needed. We will see if treating constipation will help some of her left-sided abdominal pain. She can continue Anusol as needed. If her colonoscopy does not yield any other pathology, recommend hemorrhoid banding for more definitive therapy of hemorrhoids. Following discussion of her some benefit as colonoscopy she wanted to proceed. She was amenable to banding if needed.Further recommendations pending the results for exam.  GERD - recommend trial of omeprazole 20 mg once daily. I suspect this will help her symptoms, she can increase  dose if needed. We will reassess her at time of colonoscopy.  [place]imeon Cellar, MD [facility]  CC: Shelly Bombard, MD

## 2017-12-26 NOTE — Patient Instructions (Addendum)
If you are age 45 or older, your body mass index should be between 23-30. Your Body mass index is 25.46 kg/m. If this is out of the aforementioned range listed, please consider follow up with your Primary Care Provider.  If you are age 36 or younger, your body mass index should be between 19-25. Your Body mass index is 25.46 kg/m. If this is out of the aformentioned range listed, please consider follow up with your Primary Care Provider.   You have been scheduled for a colonoscopy. Please follow written instructions given to you at your visit today.  Please pick up your prep supplies at the pharmacy within the next 1-3 days. If you use inhalers (even only as needed), please bring them with you on the day of your procedure. Your physician has requested that you go to www.startemmi.com and enter the access code given to you at your visit today. This web site gives a general overview about your procedure. However, you should still follow specific instructions given to you by our office regarding your preparation for the procedure.   Please purchase the following medications over the counter and take as directed: Miralax: Take as directed, once to twice daily  We have sent the following medications to your pharmacy for you to pick up at your convenience: Omeprazole 20mg , Take once daily  Thank you for entrusting me with your care and for choosing [facility], Dr. Advance Cellar

## 2017-12-27 ENCOUNTER — Encounter: Payer: Self-pay | Admitting: Gastroenterology

## 2018-01-02 ENCOUNTER — Other Ambulatory Visit: Payer: Self-pay | Admitting: Internal Medicine

## 2018-01-02 DIAGNOSIS — Z1231 Encounter for screening mammogram for malignant neoplasm of breast: Secondary | ICD-10-CM

## 2018-01-08 ENCOUNTER — Encounter: Payer: 59 | Admitting: Gastroenterology

## 2018-01-09 ENCOUNTER — Encounter: Payer: Self-pay | Admitting: Obstetrics

## 2018-01-09 ENCOUNTER — Ambulatory Visit (INDEPENDENT_AMBULATORY_CARE_PROVIDER_SITE_OTHER): Payer: 59 | Admitting: Obstetrics

## 2018-01-09 VITALS — BP 130/87 | HR 73 | Wt 163.0 lb

## 2018-01-09 DIAGNOSIS — K581 Irritable bowel syndrome with constipation: Secondary | ICD-10-CM

## 2018-01-09 DIAGNOSIS — K649 Unspecified hemorrhoids: Secondary | ICD-10-CM

## 2018-01-09 DIAGNOSIS — Z113 Encounter for screening for infections with a predominantly sexual mode of transmission: Secondary | ICD-10-CM

## 2018-01-09 DIAGNOSIS — Z01419 Encounter for gynecological examination (general) (routine) without abnormal findings: Secondary | ICD-10-CM

## 2018-01-09 MED ORDER — DICYCLOMINE HCL 20 MG PO TABS: 20.0000 mg | ORAL_TABLET | Freq: Three times a day (TID) | ORAL | 11 refills | Status: DC

## 2018-01-09 NOTE — Progress Notes (Signed)
Patient presents for her Annual Exam today.  KM:[id number]   Pt has had Hyst.  Mammogram:12/30/2016  WNL scheduled in 2 weeks  STD Screening: None / declines

## 2018-01-09 NOTE — Patient Instructions (Signed)
Irritable Bowel Syndrome, Adult Irritable bowel syndrome (IBS) is not one specific disease. It is a group of symptoms that affects the organs responsible for digestion (gastrointestinal or GI tract). To regulate how your GI tract works, your body sends signals back and forth between your intestines and your brain. If you have IBS, there may be a problem with these signals. As a result, your GI tract does not function normally. Your intestines may become more sensitive and overreact to certain things. This is especially true when you eat certain foods or when you are under stress. There are four types of IBS. These may be determined based on the consistency of your stool:  IBS with diarrhea.  IBS with constipation.  Mixed IBS.  Unsubtyped IBS.  It is important to know which type of IBS you have. Some treatments are more likely to be helpful for certain types of IBS. What are the causes? The exact cause of IBS is not known. What increases the risk? You may have a higher risk of IBS if:  You are a woman.  You are younger than 45 years old.  You have a family history of IBS.  You have mental health problems.  You have had bacterial infection of your GI tract.  What are the signs or symptoms? Symptoms of IBS vary from person to person. The main symptom is abdominal pain or discomfort. Additional symptoms usually include one or more of the following:  Diarrhea, constipation, or both.  Abdominal swelling or bloating.  Feeling full or sick after eating a small or regular-size meal.  Frequent gas.  Mucus in the stool.  A feeling of having more stool left after a bowel movement.  Symptoms tend to come and go. They may be associated with stress, psychiatric conditions, or nothing at all. How is this diagnosed? There is no specific test to diagnose IBS. Your health care provider will make a diagnosis based on a physical exam, medical history, and your symptoms. You may have other  tests to rule out other conditions that may be causing your symptoms. These may include:  Blood tests.  X-rays.  CT scan.  Endoscopy and colonoscopy. This is a test in which your GI tract is viewed with a long, thin, flexible tube.  How is this treated? There is no cure for IBS, but treatment can help relieve symptoms. IBS treatment often includes:  Changes to your diet, such as: ? Eating more fiber. ? Avoiding foods that cause symptoms. ? Drinking more water. ? Eating regular, medium-sized portioned meals.  Medicines. These may include: ? Fiber supplements if you have constipation. ? Medicine to control diarrhea (antidiarrheal medicines). ? Medicine to help control muscle spasms in your GI tract (antispasmodic medicines). ? Medicines to help with any mental health issues, such as antidepressants or tranquilizers.  Therapy. ? Talk therapy may help with anxiety, depression, or other mental health issues that can make IBS symptoms worse.  Stress reduction. ? Managing your stress can help keep symptoms under control.  Follow these instructions at home:  Take medicines only as directed by your health care provider.  Eat a healthy diet. ? Avoid foods and drinks with added sugar. ? Include more whole grains, fruits, and vegetables gradually into your diet. This may be especially helpful if you have IBS with constipation. ? Avoid any foods and drinks that make your symptoms worse. These may include dairy products and caffeinated or carbonated drinks. ? Do not eat large meals. ? Drink enough   fluid to keep your urine clear or pale yellow.  Exercise regularly. Ask your health care provider for recommendations of good activities for you.  Keep all follow-up visits as directed by your health care provider. This is important. Contact a health care provider if:  You have constant pain.  You have trouble or pain with swallowing.  You have worsening diarrhea. Get help right away  if:  You have severe and worsening abdominal pain.  You have diarrhea and: ? You have a rash, stiff neck, or severe headache. ? You are irritable, sleepy, or difficult to awaken. ? You are weak, dizzy, or extremely thirsty.  You have bright red blood in your stool or you have black tarry stools.  You have unusual abdominal swelling that is painful.  You vomit continuously.  You vomit blood (hematemesis).  You have both abdominal pain and a fever. This information is not intended to replace advice given to you by your health care provider. Make sure you discuss any questions you have with your health care provider. Document Released: 08/22/2005 Document Revised: 01/22/2016 Document Reviewed: 05/09/2014 Elsevier Interactive Patient Education  2018 Elsevier Inc. Diet for Irritable Bowel Syndrome When you have irritable bowel syndrome (IBS), the foods you eat and your eating habits are very important. IBS may cause various symptoms, such as abdominal pain, constipation, or diarrhea. Choosing the right foods can help ease discomfort caused by these symptoms. Work with your health care provider and dietitian to find the best eating plan to help control your symptoms. What general guidelines do I need to follow?  Keep a food diary. This will help you identify foods that cause symptoms. Write down: ? What you eat and when. ? What symptoms you have. ? When symptoms occur in relation to your meals.  Avoid foods that cause symptoms. Talk with your dietitian about other ways to get the same nutrients that are in these foods.  Eat more foods that contain fiber. Take a fiber supplement if directed by your dietitian.  Eat your meals slowly, in a relaxed setting.  Aim to eat 5-6 small meals per day. Do not skip meals.  Drink enough fluids to keep your urine clear or pale yellow.  Ask your health care provider if you should take an over-the-counter probiotic during flare-ups to help restore  healthy gut bacteria.  If you have cramping or diarrhea, try making your meals low in fat and high in carbohydrates. Examples of carbohydrates are pasta, rice, whole grain breads and cereals, fruits, and vegetables.  If dairy products cause your symptoms to flare up, try eating less of them. You might be able to handle yogurt better than other dairy products because it contains bacteria that help with digestion. What foods are not recommended? The following are some foods and drinks that may worsen your symptoms:  Fatty foods, such as French fries.  Milk products, such as cheese or ice cream.  Chocolate.  Alcohol.  Products with caffeine, such as coffee.  Carbonated drinks, such as soda.  The items listed above may not be a complete list of foods and beverages to avoid. Contact your dietitian for more information. What foods are good sources of fiber? Your health care provider or dietitian may recommend that you eat more foods that contain fiber. Fiber can help reduce constipation and other IBS symptoms. Add foods with fiber to your diet a little at a time so that your body can get used to them. Too much fiber at once   might cause gas and swelling of your abdomen. The following are some foods that are good sources of fiber:  Apples.  Peaches.  Pears.  Berries.  Figs.  Broccoli (raw).  Cabbage.  Carrots.  Raw peas.  Kidney beans.  Lima beans.  Whole grain bread.  Whole grain cereal.  Where to find more information: International Foundation for Functional Gastrointestinal Disorders: www.iffgd.org National Institute of Diabetes and Digestive and Kidney Diseases: www.niddk.nih.gov/health-information/health-topics/digestive-diseases/ibs/Pages/facts.aspx This information is not intended to replace advice given to you by your health care provider. Make sure you discuss any questions you have with your health care provider. Document Released: 11/12/2003 Document Revised:  01/28/2016 Document Reviewed: 11/22/2013 Elsevier Interactive Patient Education  2018 Elsevier Inc. Probiotics What are probiotics? Probiotics are the good bacteria and yeasts that live in your body and keep you and your digestive system healthy. Probiotics also help your body's defense (immune) system and protect your body against bad bacterial growth. Certain foods contain probiotics, such as yogurt. Probiotics can also be purchased as a supplement. As with any supplement or drug, it is important to discuss its use with your health care provider. What affects the balance of bacteria in my body? The balance of bacteria in your body can be affected by:  Antibiotic medicines. Antibiotics are sometimes necessary to treat infection. Unfortunately, they may kill good or friendly bacteria in your body as well as the bad bacteria. This may lead to stomach problems like diarrhea, gas, and cramping.  Disease. Some conditions are the result of an overgrowth of bad bacteria, yeasts, parasites, or fungi. These conditions include: ? Infectious diarrhea. ? Stomach and respiratory infections. ? Skin infections. ? Irritable bowel syndrome (IBS). ? Inflammatory bowel diseases. ? Ulcer due to Helicobacter pylori (H. pylori) infection. ? Tooth decay and periodontal disease. ? Vaginal infections.  Stress and poor diet may also lower the good bacteria in your body. What type of probiotic is right for me? Probiotics are available over the counter at your local pharmacy, health food, or grocery store. They come in many different forms, combinations of strains, and dosing strengths. Some may need to be refrigerated. Always read the label for storage and usage instructions. Specific strains have been shown to be more effective for certain conditions. Ask your health care provider what option is best for you. Why would I need probiotics? There are many reasons your health care provider might recommend a probiotic  supplement, including:  Diarrhea.  Constipation.  IBS.  Respiratory infections.  Yeast infections.  Acne, eczema, and other skin conditions.  Frequent urinary tract infections (UTIs).  Are there side effects of probiotics? Some people experience mild side effects when taking probiotics. Side effects are usually temporary and may include:  Gas.  Bloating.  Cramping.  Rarely, serious side effects, such as infection or immune system changes, may occur. What else do I need to know about probiotics?  There are many different strains of probiotics. Certain strains may be more effective depending on your condition. Probiotics are available in varying doses. Ask your health care provider which probiotic you should use and how often.  If you are taking probiotics along with antibiotics, it is generally recommended to wait at least 2 hours between taking the antibiotic and taking the probiotic. For more information: National Center for Complementary and Alternative Medicine http://nccam.nih.gov/ This information is not intended to replace advice given to you by your health care provider. Make sure you discuss any questions you have with your health care   provider. Document Released: 03/19/2014 Document Revised: 07/19/2016 Document Reviewed: 11/19/2013 Elsevier Interactive Patient Education  2017 Elsevier Inc.  

## 2018-01-09 NOTE — Progress Notes (Signed)
Subjective:        Michele Gilmore is a 47 y.o. female here for a routine exam.  Current complaints: Constipation, bloating after meals, and internal  hemorrhoids..    Personal health questionnaire:  Is patient Ashkenazi Jewish, have a family history of breast and/or ovarian cancer: no Is there a family history of uterine cancer diagnosed at age < 31, gastrointestinal cancer, urinary tract cancer, family member who is a Field seismologist syndrome-associated carrier: no Is the patient overweight and hypertensive, family history of diabetes, personal history of gestational diabetes, preeclampsia or PCOS: no Is patient over 104, have PCOS,  family history of premature CHD under age 17, diabetes, smoke, have hypertension or peripheral artery disease:  no At any time, has a partner hit, kicked or otherwise hurt or frightened you?: no Over the past 2 weeks, have you felt down, depressed or hopeless?: no Over the past 2 weeks, have you felt little interest or pleasure in doing things?:no   Gynecologic History Patient's last menstrual period was 10/14/2011. Contraception: status post hysterectomy Last Pap: 2013. Results were: normal Last mammogram: 2018. Results were: normal  Obstetric History OB History  Gravida Para Term Preterm AB Living  '5 3 3 '$ 0 2 3  SAB TAB Ectopic Multiple Live Births  2 0 0 0 3    # Outcome Date GA Lbr Len/2nd Weight Sex Delivery Anes PTL Lv  5 Term 05/09/01 [redacted]w[redacted]d 7 lb 12 oz (3.515 kg) M Vag-Spont None  LIV  4 SAB 2000 189w0d     DEC  3 Term 08/20/92 4034w0d lb 14 oz (3.572 kg) F Vag-Spont None  LIV  2 SAB 1992 [id number]   DEC  1 Term 01/28/89   7 lb 5 oz (3.317 kg) M Vag-Spont None  LIV    Past Medical History:  Diagnosis Date  . Cataract   . Heart murmur    as child, no problem  . Hypertension     Past Surgical History:  Procedure Laterality Date  . ABDOMINAL HYSTERECTOMY  10/21/2011   Procedure: HYSTERECTOMY ABDOMINAL;  Surgeon: CharShelly Bombard;   [facility];  Service: Gynecology;  Laterality: N/A;  Right oophorectomy  . EYE SURGERY  2013   [facility]   . SVD     x 3  . TUBAL LIGATION  06/2001  . WISDOM TOOTH EXTRACTION       Current Outpatient Medications:  .  cyanocobalamin ([facility] VITAMIN B12) 1000 MCG tablet, Take 2,000 mcg by mouth daily., Disp: , Rfl:  .  lisinopril-hydrochlorothiazide (PRINZIDE,ZESTORETIC) 20-25 MG tablet, TAKE 1 TABLET BY MOUTH  DAILY, Disp: 90 tablet, Rfl: 0 .  meloxicam (MOBIC) 15 MG tablet, Take 1 tablet (15 mg total) by mouth daily., Disp: 90 tablet, Rfl: 2 .  Omega-3 Fatty Acids (FISH OIL) 1200 MG CAPS, Take one by mouth daily, Disp: , Rfl:  .  thiamine (VITAMIN B-1) 100 MG tablet, Take 100 mg by mouth daily., Disp: , Rfl:  .  dicyclomine (BENTYL) 20 MG tablet, Take 1 tablet (20 mg total) by mouth every 8 (eight) hours., Disp: 90 tablet, Rfl: 11 .  omeprazole (PRILOSEC) 20 MG capsule, Take 1 capsule (20 mg total) by mouth daily. (Patient not taking: Reported on 01/09/2018), Disp: 30 capsule, Rfl: 3 .  polyethylene glycol powder (GLYCOLAX/MIRALAX) powder, Use daily as directed (Patient not taking: Reported on 01/09/2018), Disp: 255 g, Rfl: 3 .  SUPRProsper  KIT 17.5-3.13-1.6 GM/177ML SOLN, Suprep-Use as directed (Patient not taking: Reported on 01/09/2018), Disp: 354 mL, Rfl: 0 No Known Allergies  Social History   Tobacco Use  . Smoking status: Never Smoker  . Smokeless tobacco: Never Used  Substance Use Topics  . Alcohol use: Yes    Alcohol/week: 0.0 oz    Comment: socially    Family History  Problem Relation Age of Onset  . Hypertension Mother   . Stroke Mother   . Hypertension Father   . Prostate cancer Maternal Grandfather   . Pancreatic cancer Maternal Uncle       Review of Systems  Constitutional: negative for fatigue and weight loss Respiratory: negative for cough and wheezing Cardiovascular: negative for chest pain, fatigue and palpitations Gastrointestinal: negative  for abdominal pain and change in bowel habits Musculoskeletal:negative for myalgias Neurological: negative for gait problems and tremors Behavioral/Psych: negative for abusive relationship, depression Endocrine: negative for temperature intolerance    Genitourinary:negative for abnormal menstrual periods, genital lesions, hot flashes, sexual problems and vaginal discharge Integument/breast: negative for breast lump, breast tenderness, nipple discharge and skin lesion(s)    Objective:       BP 130/87   Pulse 73   Wt 163 lb (73.9 kg)   LMP 10/14/2011   BMI 25.91 kg/m  General:   alert and no distress  Skin:   no rash or abnormalities  Lungs:   clear to auscultation bilaterally  Heart:   regular rate and rhythm, S1, S2 normal, no murmur, click, rub or gallop  Breasts:   normal without suspicious masses, skin or nipple changes or axillary nodes  Abdomen:  normal findings: no organomegaly, soft, non-tender and no hernia  Pelvis:  External genitalia: normal general appearance Urinary system: urethral meatus normal and bladder without fullness, nontender Vaginal: normal without tenderness, induration or masses.  Vaginal cuff is clean and intact. Cervix: absent Adnexa: normal bimanual exam Uterus: absent   Lab Review Urine pregnancy test Labs reviewed yes Radiologic studies reviewed yes  50% of 20 min visit spent on counseling and coordination of care.   Assessment:     1. Encounter for annual routine gynecological examination Rx: - Cervicovaginal ancillary only  2. Irritable bowel syndrome with constipation Rx: - dicyclomine (BENTYL) 20 MG tablet; Take 1 tablet (20 mg total) by mouth every 8 (eight) hours.  Dispense: 90 tablet; Refill: 11  3. Hemorrhoids, unspecified hemorrhoid type - followed by GI     Plan:    Education reviewed: calcium supplements, depression evaluation, low fat, low cholesterol diet, safe sex/STD prevention, self breast exams and weight bearing  exercise. Follow up in: 1 year.   Meds ordered this encounter  Medications  . dicyclomine (BENTYL) 20 MG tablet    Sig: Take 1 tablet (20 mg total) by mouth every 8 (eight) hours.    Dispense:  90 tablet    Refill:  11   No orders of the defined types were placed in this encounter.   Shelly Bombard MD 01-09-2018

## 2018-01-10 LAB — CERVICOVAGINAL ANCILLARY ONLY
Bacterial vaginitis: NEGATIVE
CANDIDA VAGINITIS: NEGATIVE
Chlamydia: NEGATIVE
Neisseria Gonorrhea: NEGATIVE
Trichomonas: NEGATIVE

## 2018-01-23 ENCOUNTER — Ambulatory Visit
Admission: RE | Admit: 2018-01-23 | Discharge: 2018-01-23 | Disposition: A | Discharge status: Home / Self Care | Payer: 59 | Source: Ambulatory Visit | Attending: Internal Medicine | Admitting: Internal Medicine

## 2018-01-23 DIAGNOSIS — Z1231 Encounter for screening mammogram for malignant neoplasm of breast: Secondary | ICD-10-CM | POA: Diagnosis not present

## 2018-03-06 ENCOUNTER — Other Ambulatory Visit: Payer: Self-pay | Admitting: Internal Medicine

## 2018-03-26 ENCOUNTER — Ambulatory Visit: Payer: 59 | Admitting: Internal Medicine

## 2018-03-26 DIAGNOSIS — Z0289 Encounter for other administrative examinations: Secondary | ICD-10-CM

## 2018-03-26 NOTE — Progress Notes (Deleted)
Subjective:    Patient ID: Michele Gilmore, female    DOB: [birth year], 47 y.o.   MRN: [id number]  HPI  Pt presents to the clinic today with c/o right sided back pain.  Review of Systems      Past Medical History:  Diagnosis Date  . Cataract   . Heart murmur    as child, no problem  . Hypertension     Current Outpatient Medications  Medication Sig Dispense Refill  . cyanocobalamin ([facility] VITAMIN B12) 1000 MCG tablet Take 2,000 mcg by mouth daily.    Marland Kitchen dicyclomine (BENTYL) 20 MG tablet Take 1 tablet (20 mg total) by mouth every 8 (eight) hours. 90 tablet 11  . lisinopril-hydrochlorothiazide (PRINZIDE,ZESTORETIC) 20-25 MG tablet TAKE 1 TABLET BY MOUTH  DAILY 90 tablet 0  . lisinopril-hydrochlorothiazide (PRINZIDE,ZESTORETIC) 20-25 MG tablet TAKE 1 TABLET BY MOUTH DAILY 90 tablet 0  . meloxicam (MOBIC) 15 MG tablet Take 1 tablet (15 mg total) by mouth daily. 90 tablet 2  . Omega-3 Fatty Acids (FISH OIL) 1200 MG CAPS Take one by mouth daily    . omeprazole (PRILOSEC) 20 MG capsule Take 1 capsule (20 mg total) by mouth daily. (Patient not taking: Reported on 01/09/2018) 30 capsule 3  . polyethylene glycol powder (GLYCOLAX/MIRALAX) powder Use daily as directed (Patient not taking: Reported on 01/09/2018) 255 g 3  . SUPREP BOWEL PREP KIT 17.5-3.13-1.6 GM/177ML SOLN Suprep-Use as directed (Patient not taking: Reported on 01/09/2018) 354 mL 0  . thiamine (VITAMIN B-1) 100 MG tablet Take 100 mg by mouth daily.     No current facility-administered medications for this visit.     No Known Allergies  Family History  Problem Relation Age of Onset  . Hypertension Mother   . Stroke Mother   . Hypertension Father   . Prostate cancer Maternal Grandfather   . Pancreatic cancer Maternal Uncle     Social History   Socioeconomic History  . Marital status: Single    Spouse name: Not on file  . Number of children: 3  . Years of education: Not on file  . Highest education level: Not on file    Occupational History  . Not on file  Social Needs  . Financial resource strain: Not on file  . Food insecurity:    Worry: Not on file    Inability: Not on file  . Transportation needs:    Medical: Not on file    Non-medical: Not on file  Tobacco Use  . Smoking status: Never Smoker  . Smokeless tobacco: Never Used  Substance and Sexual Activity  . Alcohol use: Yes    Alcohol/week: 0.0 oz    Comment: socially  . Drug use: No  . Sexual activity: Not Currently    Birth control/protection: Surgical    Comment: hysterectomy   Lifestyle  . Physical activity:    Days per week: Not on file    Minutes per session: Not on file  . Stress: Not on file  Relationships  . Social connections:    Talks on phone: Not on file    Gets together: Not on file    Attends religious service: Not on file    Active member of club or organization: Not on file    Attends meetings of clubs or organizations: Not on file    Relationship status: Not on file  . Intimate partner violence:    Fear of current or ex partner: Not on file  Emotionally abused: Not on file    Physically abused: Not on file    Forced sexual activity: Not on file  Other Topics Concern  . Not on file  Social History Narrative  . Not on file     Constitutional: Denies fever, malaise, fatigue, headache or abrupt weight changes.  HEENT: Denies eye pain, eye redness, ear pain, ringing in the ears, wax buildup, runny nose, nasal congestion, bloody nose, or sore throat. Respiratory: Denies difficulty breathing, shortness of breath, cough or sputum production.   Cardiovascular: Denies chest pain, chest tightness, palpitations or swelling in the hands or feet.  Gastrointestinal: Denies abdominal pain, bloating, constipation, diarrhea or blood in the stool.  GU: Denies urgency, frequency, pain with urination, burning sensation, blood in urine, odor or discharge. Musculoskeletal: Pt reports right sided back pain. Denies decrease in  range of motion, difficulty with gait, or joint pain and swelling.  Skin: Denies redness, rashes, lesions or ulcercations.  Neurological: Denies dizziness, difficulty with memory, difficulty with speech or problems with balance and coordination.  Psych: Denies anxiety, depression, SI/HI.  No other specific complaints in a complete review of systems (except as listed in HPI above).  Objective:   Physical Exam        Assessment & Plan:

## 2018-03-27 ENCOUNTER — Encounter: Payer: Self-pay | Admitting: Internal Medicine

## 2018-03-27 ENCOUNTER — Other Ambulatory Visit: Payer: Self-pay | Admitting: Internal Medicine

## 2018-03-27 ENCOUNTER — Ambulatory Visit (INDEPENDENT_AMBULATORY_CARE_PROVIDER_SITE_OTHER): Payer: 59 | Admitting: Internal Medicine

## 2018-03-27 VITALS — BP 122/64 | HR 80 | Temp 97.8°F | Wt 159.2 lb

## 2018-03-27 DIAGNOSIS — R350 Frequency of micturition: Secondary | ICD-10-CM | POA: Diagnosis not present

## 2018-03-27 DIAGNOSIS — M545 Low back pain, unspecified: Secondary | ICD-10-CM

## 2018-03-27 LAB — POC URINALSYSI DIPSTICK (AUTOMATED)
Bilirubin, UA: NEGATIVE
Blood, UA: NEGATIVE
GLUCOSE UA: NEGATIVE
KETONES UA: NEGATIVE
Leukocytes, UA: NEGATIVE
Nitrite, UA: NEGATIVE
Protein, UA: NEGATIVE
SPEC GRAV UA: 1.01 (ref 1.010–1.025)
Urobilinogen, UA: 0.2 E.U./dL
pH, UA: 6 (ref 5.0–8.0)

## 2018-03-27 MED ORDER — ORPHENADRINE CITRATE ER 100 MG PO TB12
100.0000 mg | ORAL_TABLET | Freq: Two times a day (BID) | ORAL | 0 refills | Status: AC
Start: 2018-03-27 — End: 2018-04-03

## 2018-03-27 MED ORDER — NAPROXEN 500 MG PO TABS: 500.0000 mg | ORAL_TABLET | Freq: Two times a day (BID) | ORAL | 0 refills | Status: DC

## 2018-03-27 NOTE — Patient Instructions (Signed)

## 2018-03-27 NOTE — Progress Notes (Signed)
Subjective:    Patient ID: Michele Gilmore, female    DOB: [birth year], 47 y.o.   MRN: [id number]  HPI  Pt presents to the clinic today with complaint of right side low back pain.  She reports this started 1 week ago.  She describes the pain as sore and achy.  The pain does not radiate.  She denies numbness or tingling in her lower extremities.  She denies loss of bowel or bladder.  She denies any injury to the back that she is aware of.  She is having some urinary frequency but denies urgency, dysuria or blood in her urine.  She denies any vaginal complaints.  She has not noticed a rash in the area.  She has not taken anything over-the-counter for her symptoms.  Review of Systems  Past Medical History:  Diagnosis Date  . Cataract   . Heart murmur    as child, no problem  . Hypertension     Current Outpatient Medications  Medication Sig Dispense Refill  . cyanocobalamin ([facility] VITAMIN B12) 1000 MCG tablet Take 2,000 mcg by mouth daily.    Marland Kitchen dicyclomine (BENTYL) 20 MG tablet Take 1 tablet (20 mg total) by mouth every 8 (eight) hours. 90 tablet 11  . lisinopril-hydrochlorothiazide (PRINZIDE,ZESTORETIC) 20-25 MG tablet TAKE 1 TABLET BY MOUTH DAILY 90 tablet 0  . meloxicam (MOBIC) 15 MG tablet Take 1 tablet (15 mg total) by mouth daily. 90 tablet 2  . Omega-3 Fatty Acids (FISH OIL) 1200 MG CAPS Take one by mouth daily    . omeprazole (PRILOSEC) 20 MG capsule Take 1 capsule (20 mg total) by mouth daily. 30 capsule 3  . polyethylene glycol powder (GLYCOLAX/MIRALAX) powder Use daily as directed 255 g 3  . thiamine (VITAMIN B-1) 100 MG tablet Take 100 mg by mouth daily.     No current facility-administered medications for this visit.     No Known Allergies  Family History  Problem Relation Age of Onset  . Hypertension Mother   . Stroke Mother   . Hypertension Father   . Prostate cancer Maternal Grandfather   . Pancreatic cancer Maternal Uncle     Social History   Socioeconomic  History  . Marital status: Single    Spouse name: Not on file  . Number of children: 3  . Years of education: Not on file  . Highest education level: Not on file  Occupational History  . Not on file  Social Needs  . Financial resource strain: Not on file  . Food insecurity:    Worry: Not on file    Inability: Not on file  . Transportation needs:    Medical: Not on file    Non-medical: Not on file  Tobacco Use  . Smoking status: Never Smoker  . Smokeless tobacco: Never Used  Substance and Sexual Activity  . Alcohol use: Yes    Alcohol/week: 0.0 oz    Comment: socially  . Drug use: No  . Sexual activity: Not Currently    Birth control/protection: Surgical    Comment: hysterectomy   Lifestyle  . Physical activity:    Days per week: Not on file    Minutes per session: Not on file  . Stress: Not on file  Relationships  . Social connections:    Talks on phone: Not on file    Gets together: Not on file    Attends religious service: Not on file    Active member of club or organization:  Not on file    Attends meetings of clubs or organizations: Not on file    Relationship status: Not on file  . Intimate partner violence:    Fear of current or ex partner: Not on file    Emotionally abused: Not on file    Physically abused: Not on file    Forced sexual activity: Not on file  Other Topics Concern  . Not on file  Social History Narrative  . Not on file     Constitutional: Denies fever, malaise, fatigue, headache or abrupt weight changes.  Respiratory: Denies difficulty breathing, shortness of breath, cough or sputum production.   Cardiovascular: Denies chest pain, chest tightness, palpitations or swelling in the hands or feet.  Gastrointestinal: Denies abdominal pain, bloating, constipation, diarrhea or blood in the stool.  GU: Pt reports urinary frequency. Denies urgency, pain with urination, burning sensation, blood in urine, odor or discharge. Musculoskeletal: Pt  reports right-sided back pain.  Denies decrease in range of motion, difficulty with gait, or joint pain and swelling.  Skin: Denies redness, rashes, lesions or ulcercations.   No other specific complaints in a complete review of systems (except as listed in HPI above).     Objective:   Physical Exam    BP 122/64   Pulse 80   Temp 97.8 F (36.6 C) (Oral)   Wt 159 lb 4 oz (72.2 kg)   LMP 10/14/2011   SpO2 92%   BMI 25.32 kg/m  Wt Readings from Last 3 Encounters:  03/27/18 159 lb 4 oz (72.2 kg)  01/09/18 163 lb (73.9 kg)  12/26/17 160 lb 2 oz (72.6 kg)    General: Appears her stated age, well developed, well nourished in NAD. Skin: Warm, dry and intact. No rashes noted. Cardiovascular: Normal rate and rhythm. S1,S2 noted.  No murmur, rubs or gallops noted.  Pulmonary/Chest: Normal effort and positive vesicular breath sounds. No respiratory distress. No wheezes, rales or ronchi noted.  Abdomen: Soft and nontender. Normal bowel sounds. No distention or masses noted. No CVA tenderness noted. Musculoskeletal: Normal flexion, extension and rotation of the spine.  No bony tenderness noted over the spine.  Pain with palpation of the right paralumbar muscles.  Strength 5/5 bilateral lower extremities.  No difficulty with gait. Neurological: Alert and oriented. Negative SLR on the right.  BMET    Component Value Date/Time   NA 137 09/07/2017 1503   K 3.6 09/07/2017 1503   CL 101 09/07/2017 1503   CO2 29 09/07/2017 1503   GLUCOSE 80 09/07/2017 1503   GLUCOSE 86 07/25/2006 1102   BUN 15 09/07/2017 1503   CREATININE 1.00 09/07/2017 1503   CALCIUM 9.8 09/07/2017 1503   GFRNONAA >90 10/22/2011 0512   GFRAA >90 10/22/2011 0512    Lipid Panel     Component Value Date/Time   CHOL 156 09/07/2017 1503   TRIG 63.0 09/07/2017 1503   TRIG 48 07/25/2006 1102   HDL 68.80 09/07/2017 1503   CHOLHDL 2 09/07/2017 1503   VLDL 12.6 09/07/2017 1503   LDLCALC 74 09/07/2017 1503    CBC      Component Value Date/Time   WBC 4.7 09/07/2017 1503   RBC 4.70 09/07/2017 1503   HGB 13.1 09/07/2017 1503   HCT 40.4 09/07/2017 1503   PLT 206.0 09/07/2017 1503   MCV 86.0 09/07/2017 1503   MCH 24.4 (L) 10/22/2011 1118   MCHC 32.5 09/07/2017 1503   RDW 14.4 09/07/2017 1503   LYMPHSABS 1.5 07/11/2016 1544  MONOABS 0.3 07/11/2016 1544   EOSABS 0.1 07/11/2016 1544   BASOSABS 0.0 07/11/2016 1544    Hgb A1C No results found for: HGBA1C        Assessment & Plan:  Acute Right Side Low Back Pain:  Urinalysis: normal Likely muscular eRx for Naproxen 500 mg 2 times a day for 1 week with food eRx for Norflex 100 mg 2 times a day for 1 week-sedation caution given Heat may be helpful Back exercises given  Return precautions discussed Webb Silversmith, NP

## 2018-03-28 ENCOUNTER — Encounter: Payer: Self-pay | Admitting: Internal Medicine

## 2018-03-28 NOTE — Telephone Encounter (Signed)
You prescribed #14 yesterday, requesting 90 day supply.... Please advise

## 2018-03-28 NOTE — Telephone Encounter (Signed)
I don't want a 90 day supply, given her history of HTN and not wanting it to cause NSAID induced gastritis. She needs to fill the #14

## 2018-04-05 ENCOUNTER — Ambulatory Visit: Payer: 59 | Admitting: Obstetrics

## 2018-04-10 ENCOUNTER — Ambulatory Visit (INDEPENDENT_AMBULATORY_CARE_PROVIDER_SITE_OTHER): Payer: 59 | Admitting: Obstetrics

## 2018-04-10 ENCOUNTER — Encounter: Payer: Self-pay | Admitting: Obstetrics

## 2018-04-10 VITALS — BP 125/75 | HR 71 | Wt 153.6 lb

## 2018-04-10 DIAGNOSIS — M545 Low back pain: Secondary | ICD-10-CM

## 2018-04-10 DIAGNOSIS — R300 Dysuria: Secondary | ICD-10-CM | POA: Diagnosis not present

## 2018-04-10 DIAGNOSIS — G8929 Other chronic pain: Secondary | ICD-10-CM

## 2018-04-10 DIAGNOSIS — N9489 Other specified conditions associated with female genital organs and menstrual cycle: Secondary | ICD-10-CM

## 2018-04-10 DIAGNOSIS — R102 Pelvic and perineal pain: Secondary | ICD-10-CM

## 2018-04-10 DIAGNOSIS — N949 Unspecified condition associated with female genital organs and menstrual cycle: Secondary | ICD-10-CM | POA: Diagnosis not present

## 2018-04-10 LAB — POCT URINALYSIS DIPSTICK
BILIRUBIN UA: NEGATIVE
GLUCOSE UA: NEGATIVE
KETONES UA: NEGATIVE
Leukocytes, UA: NEGATIVE
Nitrite, UA: NEGATIVE
Odor: NEGATIVE
PH UA: 7 (ref 5.0–8.0)
Protein, UA: NEGATIVE
RBC UA: NEGATIVE
SPEC GRAV UA: 1.01 (ref 1.010–1.025)
UROBILINOGEN UA: 0.2 U/dL

## 2018-04-10 NOTE — Patient Instructions (Signed)
Ovarian Cyst An ovarian cyst is a fluid-filled sac that forms on an ovary. The ovaries are small organs that produce eggs in women. Various types of cysts can form on the ovaries. Some may cause symptoms and require treatment. Most ovarian cysts go away on their own, are not cancerous (are benign), and do not cause problems. Common types of ovarian cysts include:  Functional (follicle) cysts. ? Occur during the menstrual cycle, and usually go away with the next menstrual cycle if you do not get pregnant. ? Usually cause no symptoms.  Endometriomas. ? Are cysts that form from the tissue that lines the uterus (endometrium). ? Are sometimes called "chocolate cysts" because they become filled with blood that turns brown. ? Can cause pain in the lower abdomen during intercourse and during your period.  Cystadenoma cysts. ? Develop from cells on the outside surface of the ovary. ? Can get very large and cause lower abdomen pain and pain with intercourse. ? Can cause severe pain if they twist or break open (rupture).  Dermoid cysts. ? Are sometimes found in both ovaries. ? May contain different kinds of body tissue, such as skin, teeth, hair, or cartilage. ? Usually do not cause symptoms unless they get very big.  Theca lutein cysts. ? Occur when too much of a certain hormone (human chorionic gonadotropin) is produced and overstimulates the ovaries to produce an egg. ? Are most common after having procedures used to assist with the conception of a baby (in vitro fertilization).  What are the causes? Ovarian cysts may be caused by:  Ovarian hyperstimulation syndrome. This is a condition that can develop from taking fertility medicines. It causes multiple large ovarian cysts to form.  Polycystic ovarian syndrome (PCOS). This is a common hormonal disorder that can cause ovarian cysts, as well as problems with your period or fertility.  What increases the risk? The following factors may make  you more likely to develop ovarian cysts:  Being overweight or obese.  Taking fertility medicines.  Taking certain forms of hormonal birth control.  Smoking.  What are the signs or symptoms? Many ovarian cysts do not cause symptoms. If symptoms are present, they may include:  Pelvic pain or pressure.  Pain in the lower abdomen.  Pain during sex.  Abdominal swelling.  Abnormal menstrual periods.  Increasing pain with menstrual periods.  How is this diagnosed? These cysts are commonly found during a routine pelvic exam. You may have tests to find out more about the cyst, such as:  Ultrasound.  X-ray of the pelvis.  CT scan.  MRI.  Blood tests.  How is this treated? Many ovarian cysts go away on their own without treatment. Your health care provider may want to check your cyst regularly for 2-3 months to see if it changes. If you are in menopause, it is especially important to have your cyst monitored closely because menopausal women have a higher rate of ovarian cancer. When treatment is needed, it may include:  Medicines to help relieve pain.  A procedure to drain the cyst (aspiration).  Surgery to remove the whole cyst.  Hormone treatment or birth control pills. These methods are sometimes used to help dissolve a cyst.  Follow these instructions at home:  Take over-the-counter and prescription medicines only as told by your health care provider.  Do not drive or use heavy machinery while taking prescription pain medicine.  Get regular pelvic exams and Pap tests as often as told by your health care   provider.  Return to your normal activities as told by your health care provider. Ask your health care provider what activities are safe for you.  Do not use any products that contain nicotine or tobacco, such as cigarettes and e-cigarettes. If you need help quitting, ask your health care provider.  Keep all follow-up visits as told by your health care provider.  This is important. Contact a health care provider if:  Your periods are late, irregular, or painful, or they stop.  You have pelvic pain that does not go away.  You have pressure on your bladder or trouble emptying your bladder completely.  You have pain during sex.  You have any of the following in your abdomen: ? A feeling of fullness. ? Pressure. ? Discomfort. ? Pain that does not go away. ? Swelling.  You feel generally ill.  You become constipated.  You lose your appetite.  You develop severe acne.  You start to have more body hair and facial hair.  You are gaining weight or losing weight without changing your exercise and eating habits.  You think you may be pregnant. Get help right away if:  You have abdominal pain that is severe or gets worse.  You cannot eat or drink without vomiting.  You suddenly develop a fever.  Your menstrual period is much heavier than usual. This information is not intended to replace advice given to you by your health care provider. Make sure you discuss any questions you have with your health care provider. Document Released: 08/22/2005 Document Revised: 03/11/2016 Document Reviewed: 01/24/2016 Elsevier Interactive Patient Education  2018 Elsevier Inc.  

## 2018-04-10 NOTE — Progress Notes (Signed)
Pt c/o intermittent urinary retention and low back pain since June.

## 2018-04-10 NOTE — Progress Notes (Addendum)
Patient ID: Michele Gilmore, female   DOB: 1970/10/30, 47 y.o.   MRN: [id number]  Chief Complaint  Patient presents with  . bladder problem    HPI Michele Gilmore is a 47 y.o. female.  Low back pain, RLQ pelvic pain and difficulty urinating since June 2019.  She is S/P TAH / Right Oophorectomy / Bilateral Salpingectomy in 2013 for fibroids. HPI  Past Medical History:  Diagnosis Date  . Cataract   . Heart murmur    as child, no problem  . Hypertension     Past Surgical History:  Procedure Laterality Date  . ABDOMINAL HYSTERECTOMY  10/21/2011   Procedure: HYSTERECTOMY ABDOMINAL;  Surgeon: Shelly Bombard, MD;  Location: [facility];  Service: Gynecology;  Laterality: N/A;  Right oophorectomy  . EYE SURGERY  2013   [facility]   . SVD     x 3  . TUBAL LIGATION  06/2001  . WISDOM TOOTH EXTRACTION      Family History  Problem Relation Age of Onset  . Hypertension Mother   . Stroke Mother   . Hypertension Father   . Prostate cancer Maternal Grandfather   . Pancreatic cancer Maternal Uncle     Social History Social History   Tobacco Use  . Smoking status: Never Smoker  . Smokeless tobacco: Never Used  Substance Use Topics  . Alcohol use: Yes    Alcohol/week: 0.0 oz    Comment: socially  . Drug use: No    No Known Allergies  Current Outpatient Medications  Medication Sig Dispense Refill  . cyanocobalamin ([facility] VITAMIN B12) 1000 MCG tablet Take 2,000 mcg by mouth daily.    Marland Kitchen lisinopril-hydrochlorothiazide (PRINZIDE,ZESTORETIC) 20-25 MG tablet TAKE 1 TABLET BY MOUTH DAILY 90 tablet 0  . meloxicam (MOBIC) 15 MG tablet Take 1 tablet (15 mg total) by mouth daily. 90 tablet 2  . naproxen (NAPROSYN) 500 MG tablet Take 1 tablet (500 mg total) by mouth 2 (two) times daily with a meal. 14 tablet 0  . Omega-3 Fatty Acids (FISH OIL) 1200 MG CAPS Take one by mouth daily    . dicyclomine (BENTYL) 20 MG tablet Take 1 tablet (20 mg total) by mouth every 8 (eight) hours.  (Patient not taking: Reported on 04/10/2018) 90 tablet 11  . omeprazole (PRILOSEC) 20 MG capsule Take 1 capsule (20 mg total) by mouth daily. (Patient not taking: Reported on 04/10/2018) 30 capsule 3  . polyethylene glycol powder (GLYCOLAX/MIRALAX) powder Use daily as directed (Patient not taking: Reported on 04/10/2018) 255 g 3  . thiamine (VITAMIN B-1) 100 MG tablet Take 100 mg by mouth daily.     No current facility-administered medications for this visit.     Review of Systems Review of Systems Constitutional: negative for fatigue and weight loss Respiratory: negative for cough and wheezing Cardiovascular: negative for chest pain, fatigue and palpitations Gastrointestinal: negative for abdominal pain and change in bowel habits Genitourinary:POSITIVE for backache, pelvic pain and dysuria Integument/breast: negative for nipple discharge Musculoskeletal:negative for myalgias Neurological: negative for gait problems and tremors Behavioral/Psych: negative for abusive relationship, depression Endocrine: negative for temperature intolerance      Blood pressure 125/75, pulse 71, weight 153 lb 9.6 oz (69.7 kg), last menstrual period 10/14/2011.  Physical Exam Physical Exam           General:  Alert and no distress Abdomen:  normal findings: no organomegaly, soft, non-tender and no hernia  Pelvis:  External genitalia: normal general appearance Urinary system:  urethral meatus normal and bladder without fullness, nontender Vaginal: normal without tenderness, induration or masses Cervix: absent Adnexa: ? Mass in right adnexal area Uterus: absent    50% of 15 min visit spent on counseling and coordination of care.   Data Reviewed Urinalysis  Assessment and Plan:    1. Chronic midline low back pain without sciatica  2. Pelvic pain - Ibuprofen prn  3. Adnexal mass - RLQ.  She is S/P TAH / RSO. Rx: - US PELVIC COMPLETE WITH TRANSVAGINAL; Future  4. Dysuria Rx: - Urine Culture - POCT  Urinalysis Dipstick    Plan    Follow up in 2 weeks  Orders Placed This Encounter  Procedures  . Urine Culture  . US PELVIC COMPLETE WITH TRANSVAGINAL    Wt. 153 Ins. [employer] No needs [facility] [place] w epic order     Standing Status:   Future    Standing Expiration Date:   06/11/2019    Order Specific Question:   Reason for Exam (SYMPTOM  OR DIAGNOSIS REQUIRED)    Answer:   Pelvic pian.  Right ovarian cyst.    Order Specific Question:   Preferred imaging location?    Answer:   [facility]  . POCT Urinalysis Dipstick   No orders of the defined types were placed in this encounter.   Shelly Bombard MD 04-10-2018

## 2018-04-12 LAB — URINE CULTURE

## 2018-04-17 ENCOUNTER — Ambulatory Visit
Admission: RE | Admit: 2018-04-17 | Discharge: 2018-04-17 | Disposition: A | Discharge status: Home / Self Care | Payer: 59 | Source: Ambulatory Visit | Attending: Obstetrics | Admitting: Obstetrics

## 2018-04-17 DIAGNOSIS — R102 Pelvic and perineal pain: Secondary | ICD-10-CM | POA: Diagnosis not present

## 2018-04-17 DIAGNOSIS — N9489 Other specified conditions associated with female genital organs and menstrual cycle: Secondary | ICD-10-CM

## 2018-04-24 ENCOUNTER — Ambulatory Visit (INDEPENDENT_AMBULATORY_CARE_PROVIDER_SITE_OTHER): Payer: 59 | Admitting: Obstetrics

## 2018-04-24 ENCOUNTER — Encounter: Payer: Self-pay | Admitting: Obstetrics

## 2018-04-24 VITALS — BP 120/76 | HR 78 | Ht 66.0 in | Wt 148.8 lb

## 2018-04-24 DIAGNOSIS — M545 Low back pain, unspecified: Secondary | ICD-10-CM

## 2018-04-24 MED ORDER — NAPROXEN 500 MG PO TBEC: 500.0000 mg | DELAYED_RELEASE_TABLET | Freq: Two times a day (BID) | ORAL | 4 refills | Status: DC

## 2018-04-24 NOTE — Progress Notes (Signed)
Patient ID: Michele Gilmore, female   DOB: July 27, 1971, 47 y.o.   MRN: [id number]  Chief Complaint  Patient presents with  . Follow-up    HPI Michele Gilmore is a 47 y.o. female.  Right-sided back and RLQ pain.  Improved.  Presents for ultrasound results. HPI  Past Medical History:  Diagnosis Date  . Cataract   . Heart murmur    as child, no problem  . Hypertension     Past Surgical History:  Procedure Laterality Date  . ABDOMINAL HYSTERECTOMY  10/21/2011   Procedure: HYSTERECTOMY ABDOMINAL;  Surgeon: Shelly Bombard, MD;  Location: [facility];  Service: Gynecology;  Laterality: N/A;  Right oophorectomy  . EYE SURGERY  2013   [facility]   . SVD     x 3  . TUBAL LIGATION  06/2001  . WISDOM TOOTH EXTRACTION      Family History  Problem Relation Age of Onset  . Hypertension Mother   . Stroke Mother   . Hypertension Father   . Prostate cancer Maternal Grandfather   . Pancreatic cancer Maternal Uncle     Social History Social History   Tobacco Use  . Smoking status: Never Smoker  . Smokeless tobacco: Never Used  Substance Use Topics  . Alcohol use: Yes    Alcohol/week: 0.0 standard drinks    Comment: socially  . Drug use: No    No Known Allergies  Current Outpatient Medications  Medication Sig Dispense Refill  . cyanocobalamin ([facility] VITAMIN B12) 1000 MCG tablet Take 2,000 mcg by mouth daily.    Marland Kitchen lisinopril-hydrochlorothiazide (PRINZIDE,ZESTORETIC) 20-25 MG tablet TAKE 1 TABLET BY MOUTH DAILY 90 tablet 0  . Omega-3 Fatty Acids (FISH OIL) 1200 MG CAPS Take one by mouth daily    . thiamine (VITAMIN B-1) 100 MG tablet Take 100 mg by mouth daily.    Marland Kitchen dicyclomine (BENTYL) 20 MG tablet Take 1 tablet (20 mg total) by mouth every 8 (eight) hours. (Patient not taking: Reported on 04/10/2018) 90 tablet 11  . naproxen (EC-NAPROSYN) 500 MG EC tablet Take 1 tablet (500 mg total) by mouth 2 (two) times daily with a meal. 30 tablet 4  . omeprazole (PRILOSEC) 20 MG capsule  Take 1 capsule (20 mg total) by mouth daily. (Patient not taking: Reported on 04/10/2018) 30 capsule 3  . polyethylene glycol powder (GLYCOLAX/MIRALAX) powder Use daily as directed (Patient not taking: Reported on 04/10/2018) 255 g 3   No current facility-administered medications for this visit.     Review of Systems Review of Systems Constitutional: negative for fatigue and weight loss Respiratory: negative for cough and wheezing Cardiovascular: negative for chest pain, fatigue and palpitations Gastrointestinal: negative for abdominal pain and change in bowel habits Genitourinary:negative Integument/breast: negative for nipple discharge Musculoskeletal:positive for myalgias Neurological: negative for gait problems and tremors Behavioral/Psych: negative for abusive relationship, depression Endocrine: negative for temperature intolerance      Blood pressure 120/76, pulse 78, height 5\' 6"  (1.676 m), weight 148 lb 12.8 oz (67.5 kg), last menstrual period 10/14/2011.  Physical Exam Physical Exam:  Deferred  50% of 10 min visit spent on counseling and coordination of care.   Data Reviewed Ultrasound: US PELVIC COMPLETE WITH TRANSVAGINAL (Accession [id number]) (Order [id number])  Imaging  Date: 04/17/2018 Department: [facility] [place] Released By: Matt Holmes Authorizing: Shelly Bombard, MD  Exam Information   Status Exam Begun  Exam Ended   Final [99] 04/17/2018 2:42 PM 04/17/2018 3:14 PM  PACS Images   Show images for US PELVIC COMPLETE WITH TRANSVAGINAL  Study Result   CLINICAL DATA:  Pelvic pain. Right adnexal mass. Previous hysterectomy and left oophorectomy.  EXAM: TRANSABDOMINAL AND TRANSVAGINAL ULTRASOUND OF PELVIS  TECHNIQUE: Both transabdominal and transvaginal ultrasound examinations of the pelvis were performed. Transabdominal technique was performed for global imaging of the pelvis including uterus, ovaries, adnexal regions,  and pelvic cul-de-sac. It was necessary to proceed with endovaginal exam following the transabdominal exam to visualize the ovaries and adnexae.  COMPARISON:  03/18/2011  FINDINGS: Uterus  Measurements: Surgically absent.  Endometrium  Thickness: Surgically absent.  Right ovary  Measurements: Not visualized, however no adnexal mass identified.  Left ovary  Measurements: Not visualized; history of prior left oophorectomy. No adnexal mass identified.  Other findings  No abnormal free fluid.  IMPRESSION: Previous hysterectomy. No pelvic mass or other significant abnormality identified.   Electronically Signed   By: Earle Gell M.D.   On: 04/17/2018 15:26    Assessment     1. Acute right-sided low back pain without sciatica - Resolved.  Ultrasound WNL's. Rx: - naproxen (EC-NAPROSYN) 500 MG EC tablet; Take 1 tablet (500 mg total) by mouth 2 (two) times daily with a meal.  Dispense: 30 tablet; Refill: 4   Plan    Follow up prn  No orders of the defined types were placed in this encounter.  Meds ordered this encounter  Medications  . naproxen (EC-NAPROSYN) 500 MG EC tablet    Sig: Take 1 tablet (500 mg total) by mouth 2 (two) times daily with a meal.    Dispense:  30 tablet    Refill:  4     Shelly Bombard MD 04-24-2018

## 2018-04-24 NOTE — Progress Notes (Signed)
Patient is in the office for U/S results from u/s on 04-17-18.

## 2018-04-30 ENCOUNTER — Telehealth: Payer: Self-pay

## 2018-04-30 NOTE — Telephone Encounter (Signed)
TC from pt c/o vaginal aching pt had recent U/S on 04/17/18 and office visit on 04/24/18. Nothing was noted on U/S Pt still believes something is wrong due to vaginal pain.  Urine Cx on 04/20/18 was resulted WNL   Please advise.

## 2018-05-08 ENCOUNTER — Telehealth: Payer: Self-pay

## 2018-05-08 NOTE — Telephone Encounter (Signed)
Returned call and pt stated that she is still having vaginal pain/discomfort, advised pt that I would have scheduler call back with appt.

## 2018-05-16 ENCOUNTER — Ambulatory Visit (INDEPENDENT_AMBULATORY_CARE_PROVIDER_SITE_OTHER): Payer: 59 | Admitting: Obstetrics

## 2018-05-16 ENCOUNTER — Encounter: Payer: Self-pay | Admitting: Obstetrics

## 2018-05-16 VITALS — BP 128/81 | HR 64 | Temp 98.2°F | Wt 154.0 lb

## 2018-05-16 DIAGNOSIS — M792 Neuralgia and neuritis, unspecified: Secondary | ICD-10-CM

## 2018-05-16 DIAGNOSIS — N898 Other specified noninflammatory disorders of vagina: Secondary | ICD-10-CM | POA: Diagnosis not present

## 2018-05-16 DIAGNOSIS — Z113 Encounter for screening for infections with a predominantly sexual mode of transmission: Secondary | ICD-10-CM | POA: Diagnosis not present

## 2018-05-16 MED ORDER — GABAPENTIN 300 MG PO CAPS
300.0000 mg | ORAL_CAPSULE | Freq: Three times a day (TID) | ORAL | 0 refills | Status: DC
Start: 2018-05-16 — End: 2019-03-28

## 2018-05-16 NOTE — Progress Notes (Signed)
Patient ID: Michele Gilmore, female   DOB: 1971/01/15, 47 y.o.   MRN: [id number]  No chief complaint on file.   HPI Michele Gilmore is a 47 y.o. female.  Still experiencing a burning pelvic pain that radiates from right inguinal area to her back.  Denies dysuria, frequency, constipation or diarrhea.  Bentyl Rx on last office visit because of a history of constipation, but she got no relief.  She has had a TAH / RSO done several years ago.  Urine culture is negative.  Ultrasound is WNL's. HPI  Past Medical History:  Diagnosis Date  . Cataract   . Heart murmur    as child, no problem  . Hypertension     Past Surgical History:  Procedure Laterality Date  . ABDOMINAL HYSTERECTOMY  10/21/2011   Procedure: HYSTERECTOMY ABDOMINAL;  Surgeon: Shelly Bombard, MD;  Location: [facility];  Service: Gynecology;  Laterality: N/A;  Right oophorectomy  . EYE SURGERY  2013   [facility]   . SVD     x 3  . TUBAL LIGATION  06/2001  . WISDOM TOOTH EXTRACTION      Family History  Problem Relation Age of Onset  . Hypertension Mother   . Stroke Mother   . Hypertension Father   . Prostate cancer Maternal Grandfather   . Pancreatic cancer Maternal Uncle     Social History Social History   Tobacco Use  . Smoking status: Never Smoker  . Smokeless tobacco: Never Used  Substance Use Topics  . Alcohol use: Yes    Alcohol/week: 0.0 standard drinks    Comment: socially  . Drug use: No    No Known Allergies  Current Outpatient Medications  Medication Sig Dispense Refill  . cyanocobalamin ([facility] VITAMIN B12) 1000 MCG tablet Take 2,000 mcg by mouth daily.    Marland Kitchen dicyclomine (BENTYL) 20 MG tablet Take 1 tablet (20 mg total) by mouth every 8 (eight) hours. (Patient not taking: Reported on 04/10/2018) 90 tablet 11  . gabapentin (NEURONTIN) 300 MG capsule Take 1 capsule (300 mg total) by mouth 3 (three) times daily. 90 capsule 0  . lisinopril-hydrochlorothiazide (PRINZIDE,ZESTORETIC) 20-25 MG tablet  TAKE 1 TABLET BY MOUTH DAILY 90 tablet 0  . naproxen (EC-NAPROSYN) 500 MG EC tablet Take 1 tablet (500 mg total) by mouth 2 (two) times daily with a meal. 30 tablet 4  . Omega-3 Fatty Acids (FISH OIL) 1200 MG CAPS Take one by mouth daily    . omeprazole (PRILOSEC) 20 MG capsule Take 1 capsule (20 mg total) by mouth daily. (Patient not taking: Reported on 04/10/2018) 30 capsule 3  . polyethylene glycol powder (GLYCOLAX/MIRALAX) powder Use daily as directed (Patient not taking: Reported on 04/10/2018) 255 g 3  . thiamine (VITAMIN B-1) 100 MG tablet Take 100 mg by mouth daily.     No current facility-administered medications for this visit.     Review of Systems Review of Systems Constitutional: negative for fatigue and weight loss Respiratory: negative for cough and wheezing Cardiovascular: negative for chest pain, fatigue and palpitations Gastrointestinal: negative for abdominal pain and change in bowel habits Genitourinary:POSITIVE for right inguinal pain that radiates to back Integument/breast: negative for nipple discharge Musculoskeletal:POSITIVE for myalgias - backache Neurological: negative for gait problems and tremors Behavioral/Psych: negative for abusive relationship, depression Endocrine: negative for temperature intolerance      Blood pressure 128/81, pulse 64, temperature 98.2 F (36.8 C), weight 154 lb (69.9 kg), last menstrual period 10/14/2011.  Physical  Exam Physical Exam           General:  Alert and no distress Abdomen:  normal findings: no organomegaly, soft, non-tender and no hernia  Pelvis:  External genitalia: normal general appearance Urinary system: urethral meatus normal and bladder without fullness, nontender Vaginal: normal without tenderness, induration or masses Cervix: absent Adnexa: normal bimanual exam Uterus: absent    50% of 15 min visit spent on counseling and coordination of care.   Data Reviewed Ultrasound:  WNL's  Assessment     1.  Neuropathic pain.  Possible fibromyalgia. Rx: - gabapentin (NEURONTIN) 300 MG capsule; Take 1 capsule (300 mg total) by mouth 3 (three) times daily.  Dispense: 90 capsule; Refill: 0  2. Vaginal discharge Rx: - Cervicovaginal ancillary only    Plan    Follow up in 1 month   No orders of the defined types were placed in this encounter.  Meds ordered this encounter  Medications  . gabapentin (NEURONTIN) 300 MG capsule    Sig: Take 1 capsule (300 mg total) by mouth 3 (three) times daily.    Dispense:  90 capsule    Refill:  0     Shelly Bombard MD 05-16-2018

## 2018-05-16 NOTE — Progress Notes (Signed)
Presents for FU.  C/o cramping, lower abdominal pain that radiates to her back 8/10 x 1+month.  Denies NV, fever, chills, urinary frequency.

## 2018-05-18 LAB — CERVICOVAGINAL ANCILLARY ONLY
BACTERIAL VAGINITIS: POSITIVE — AB
CANDIDA VAGINITIS: NEGATIVE
Trichomonas: NEGATIVE

## 2018-05-19 ENCOUNTER — Other Ambulatory Visit: Payer: Self-pay | Admitting: Obstetrics

## 2018-05-19 DIAGNOSIS — B9689 Other specified bacterial agents as the cause of diseases classified elsewhere: Secondary | ICD-10-CM

## 2018-05-19 DIAGNOSIS — N760 Acute vaginitis: Secondary | ICD-10-CM

## 2018-05-19 MED ORDER — TINIDAZOLE 500 MG PO TABS: 1000.0000 mg | ORAL_TABLET | Freq: Every day | ORAL | 2 refills | Status: DC

## 2018-06-13 ENCOUNTER — Ambulatory Visit: Payer: 59 | Admitting: Obstetrics

## 2018-07-07 ENCOUNTER — Other Ambulatory Visit: Payer: Self-pay | Admitting: Internal Medicine

## 2018-08-06 ENCOUNTER — Other Ambulatory Visit: Payer: Self-pay | Admitting: Internal Medicine

## 2018-11-11 ENCOUNTER — Other Ambulatory Visit: Payer: Self-pay | Admitting: Internal Medicine

## 2018-12-25 ENCOUNTER — Other Ambulatory Visit: Payer: Self-pay | Admitting: Internal Medicine

## 2019-01-01 ENCOUNTER — Other Ambulatory Visit: Payer: Self-pay | Admitting: Internal Medicine

## 2019-01-01 DIAGNOSIS — Z1231 Encounter for screening mammogram for malignant neoplasm of breast: Secondary | ICD-10-CM

## 2019-01-02 ENCOUNTER — Ambulatory Visit: Payer: 59 | Admitting: Internal Medicine

## 2019-02-26 ENCOUNTER — Telehealth: Payer: Self-pay | Admitting: Primary Care

## 2019-02-26 ENCOUNTER — Other Ambulatory Visit: Payer: Self-pay

## 2019-02-26 ENCOUNTER — Ambulatory Visit
Admission: RE | Admit: 2019-02-26 | Discharge: 2019-02-26 | Disposition: A | Discharge status: Home / Self Care | Payer: 59 | Source: Ambulatory Visit | Attending: Internal Medicine | Admitting: Internal Medicine

## 2019-02-26 DIAGNOSIS — Z1231 Encounter for screening mammogram for malignant neoplasm of breast: Secondary | ICD-10-CM

## 2019-02-26 MED ORDER — LISINOPRIL-HYDROCHLOROTHIAZIDE 20-25 MG PO TABS: 1.0000 | ORAL_TABLET | Freq: Every day | ORAL | 0 refills | Status: DC

## 2019-02-26 NOTE — Telephone Encounter (Signed)
Patient requesting refill. Called pharmacy and they advised her the refill request was sent but did not hear from our office   Lisinopril    Patient has 1 tablet left   [facility] mail service  Patient's C/B # [phone]

## 2019-02-26 NOTE — Telephone Encounter (Signed)
Rx sent through [facility]  

## 2019-02-27 ENCOUNTER — Other Ambulatory Visit: Payer: Self-pay | Admitting: Internal Medicine

## 2019-03-28 ENCOUNTER — Ambulatory Visit (INDEPENDENT_AMBULATORY_CARE_PROVIDER_SITE_OTHER): Payer: 59 | Admitting: Internal Medicine

## 2019-03-28 ENCOUNTER — Encounter (INDEPENDENT_AMBULATORY_CARE_PROVIDER_SITE_OTHER): Payer: Self-pay

## 2019-03-28 ENCOUNTER — Other Ambulatory Visit: Payer: Self-pay

## 2019-03-28 ENCOUNTER — Encounter: Payer: Self-pay | Admitting: Internal Medicine

## 2019-03-28 VITALS — BP 120/78 | HR 76 | Temp 98.4°F | Ht 65.5 in | Wt 163.0 lb

## 2019-03-28 DIAGNOSIS — K581 Irritable bowel syndrome with constipation: Secondary | ICD-10-CM | POA: Diagnosis not present

## 2019-03-28 DIAGNOSIS — E559 Vitamin D deficiency, unspecified: Secondary | ICD-10-CM | POA: Diagnosis not present

## 2019-03-28 DIAGNOSIS — I10 Essential (primary) hypertension: Secondary | ICD-10-CM

## 2019-03-28 DIAGNOSIS — K589 Irritable bowel syndrome without diarrhea: Secondary | ICD-10-CM | POA: Insufficient documentation

## 2019-03-28 DIAGNOSIS — Z0001 Encounter for general adult medical examination with abnormal findings: Secondary | ICD-10-CM

## 2019-03-28 DIAGNOSIS — M179 Osteoarthritis of knee, unspecified: Secondary | ICD-10-CM | POA: Insufficient documentation

## 2019-03-28 DIAGNOSIS — M1711 Unilateral primary osteoarthritis, right knee: Secondary | ICD-10-CM | POA: Diagnosis not present

## 2019-03-28 DIAGNOSIS — K219 Gastro-esophageal reflux disease without esophagitis: Secondary | ICD-10-CM | POA: Diagnosis not present

## 2019-03-28 DIAGNOSIS — M1710 Unilateral primary osteoarthritis, unspecified knee: Secondary | ICD-10-CM | POA: Insufficient documentation

## 2019-03-28 MED ORDER — MELOXICAM 15 MG PO TABS: 15.0000 mg | ORAL_TABLET | Freq: Every day | ORAL | 3 refills | Status: DC

## 2019-03-28 NOTE — Assessment & Plan Note (Signed)
Constipation Managed with Dicyclomine and Miralax as needed. Will monitor.

## 2019-03-28 NOTE — Assessment & Plan Note (Signed)
Currently using Ibuprofen for knee pain which did not help.  Was unsure of taking Meloxicam daily, therefore was using seldomly. Increased dose of Meloxicam. CMP today. Encouraged compression and bracing during activity.

## 2019-03-28 NOTE — Progress Notes (Signed)
Subjective:    Patient ID: Michele Gilmore, female    DOB: 1971/06/09, 48 y.o.   MRN: [id number]  HPI  Patient presents to the clinic today for her annual wellness examination.  She is also due for a follow up of chronic conditions.  HTN:  Her BP today is 120/78.  She is taking her Lisinopril/HCTZ as prescribed.  ECG from 10/2011 reviewed.  OA: Mainly in right knee. She reports some associated swelling. She is no longer taking Meloxicam but reports it did seem to help.  IBS: Mostly constipation. She takes Dicyclomine and Mirilax as needed with good relief.  GERD: Triggered by acidic foods. She takes Tums as needed with good relief. She has never had an upper GI.  Flu: 06/2018 Tetanus: 09/2017 Mammogram: 02/2019 Pap Smear: 12/2016, Partial Hysterectomy Vision Screening: annually Dentist:  biannually  Diet: she does eat lean meat. She consumes fruits and veggies daily. She tries to avoid fried foods. She drinks mostly water. Exercise: None  Review of Systems  Past Medical History:  Diagnosis Date  . Cataract   . Heart murmur    as child, no problem  . Hypertension     Current Outpatient Medications  Medication Sig Dispense Refill  . cyanocobalamin ([facility] VITAMIN B12) 1000 MCG tablet Take 2,000 mcg by mouth daily.    Marland Kitchen dicyclomine (BENTYL) 20 MG tablet Take 1 tablet (20 mg total) by mouth every 8 (eight) hours. (Patient not taking: Reported on 04/10/2018) 90 tablet 11  . gabapentin (NEURONTIN) 300 MG capsule Take 1 capsule (300 mg total) by mouth 3 (three) times daily. 90 capsule 0  . lisinopril-hydrochlorothiazide (ZESTORETIC) 20-25 MG tablet TAKE 1 TABLET BY MOUTH DAILY 30 tablet 0  . naproxen (EC-NAPROSYN) 500 MG EC tablet Take 1 tablet (500 mg total) by mouth 2 (two) times daily with a meal. 30 tablet 4  . Omega-3 Fatty Acids (FISH OIL) 1200 MG CAPS Take one by mouth daily    . omeprazole (PRILOSEC) 20 MG capsule Take 1 capsule (20 mg total) by mouth daily. (Patient not  taking: Reported on 04/10/2018) 30 capsule 3  . polyethylene glycol powder (GLYCOLAX/MIRALAX) powder Use daily as directed (Patient not taking: Reported on 04/10/2018) 255 g 3  . thiamine (VITAMIN B-1) 100 MG tablet Take 100 mg by mouth daily.    Marland Kitchen tinidazole (TINDAMAX) 500 MG tablet Take 2 tablets (1,000 mg total) by mouth daily with breakfast. 10 tablet 2   No current facility-administered medications for this visit.     No Known Allergies  Family History  Problem Relation Age of Onset  . Hypertension Mother   . Stroke Mother   . Hypertension Father   . Prostate cancer Maternal Grandfather   . Pancreatic cancer Maternal Uncle     Social History   Socioeconomic History  . Marital status: Single    Spouse name: Not on file  . Number of children: 3  . Years of education: Not on file  . Highest education level: Not on file  Occupational History  . Not on file  Social Needs  . Financial resource strain: Not on file  . Food insecurity    Worry: Not on file    Inability: Not on file  . Transportation needs    Medical: Not on file    Non-medical: Not on file  Tobacco Use  . Smoking status: Never Smoker  . Smokeless tobacco: Never Used  Substance and Sexual Activity  . Alcohol use: Yes  Alcohol/week: 0.0 standard drinks    Comment: socially  . Drug use: No  . Sexual activity: Not Currently    Birth control/protection: Surgical    Comment: hysterectomy   Lifestyle  . Physical activity    Days per week: Not on file    Minutes per session: Not on file  . Stress: Not on file  Relationships  . Social Herbalist on phone: Not on file    Gets together: Not on file    Attends religious service: Not on file    Active member of club or organization: Not on file    Attends meetings of clubs or organizations: Not on file    Relationship status: Not on file  . Intimate partner violence    Fear of current or ex partner: Not on file    Emotionally abused: Not on file     Physically abused: Not on file    Forced sexual activity: Not on file  Other Topics Concern  . Not on file  Social History Narrative  . Not on file     Constitutional: Denies fever, malaise, fatigue, headache or abrupt weight changes.  HEENT: Denies eye pain, eye redness, ear pain, ringing in the ears, wax buildup, runny nose, nasal congestion, bloody nose, or sore throat. Respiratory: Denies difficulty breathing, shortness of breath, cough or sputum production.   Cardiovascular: Denies chest pain, chest tightness, palpitations or swelling in the hands or feet.  Gastrointestinal: Pt reports constipation, intermittent reflux. Denies abdominal pain, bloating,  diarrhea or blood in the stool.  GU: Denies urgency, frequency, pain with urination, burning sensation, blood in urine, odor or discharge. Musculoskeletal: Pt reports right knee pain and swelling. Denies decrease in range of motion, difficulty with gait, muscle pain.  Skin: Denies redness, rashes, lesions or ulcercations.  Neurological: Denies dizziness, difficulty with memory, difficulty with speech or problems with balance and coordination.  Psych: Denies anxiety, depression, SI/HI.  No other specific complaints in a complete review of systems (except as listed in HPI above).     Objective:   Physical Exam   BP 120/78   Pulse 76   Temp 98.4 F (36.9 C) (Temporal)   Ht 5' 5.5" (1.664 m)   Wt 163 lb (73.9 kg)   LMP 10/14/2011   SpO2 98%   BMI 26.71 kg/m   Wt Readings from Last 3 Encounters:  05/16/18 154 lb (69.9 kg)  04/24/18 148 lb 12.8 oz (67.5 kg)  04/10/18 153 lb 9.6 oz (69.7 kg)    General: Appears her stated age, well developed, well nourished in NAD. Skin: Warm, dry and intact. No rashesnoted. HEENT: Head: normal shape and size; Eyes: sclera white, no icterus, conjunctiva pink, PERRLA and EOMs intact; Ears: Tm's gray and intact, normal light reflex;  Neck:  Neck supple, trachea midline. No masses, lumps  or thyromegaly present.  Cardiovascular: Faint murmur noted.  Normal rate and rhythm. S1,S2 noted.  No rubs or gallops noted. No JVD or BLE edema.  Pulmonary/Chest: Normal effort and positive vesicular breath sounds. No respiratory distress. No wheezes, rales or ronchi noted.  Abdomen: Soft and nontender. Normal bowel sounds. No distention or masses noted. Liver, spleen and kidneys non palpable. Musculoskeletal: Strength 5/5 BUE/BLE. No difficulty with gait.  Neurological: Alert and oriented. Cranial nerves II-XII grossly intact. Coordination normal.  Psychiatric: Mood and affect normal. Behavior is normal. Judgment and thought content normal.     BMET    Component Value Date/Time  NA 137 09/07/2017 1503   K 3.6 09/07/2017 1503   CL 101 09/07/2017 1503   CO2 29 09/07/2017 1503   GLUCOSE 80 09/07/2017 1503   GLUCOSE 86 07/25/2006 1102   BUN 15 09/07/2017 1503   CREATININE 1.00 09/07/2017 1503   CALCIUM 9.8 09/07/2017 1503   GFRNONAA >90 10/22/2011 0512   GFRAA >90 10/22/2011 0512    Lipid Panel     Component Value Date/Time   CHOL 156 09/07/2017 1503   TRIG 63.0 09/07/2017 1503   TRIG 48 07/25/2006 1102   HDL 68.80 09/07/2017 1503   CHOLHDL 2 09/07/2017 1503   VLDL 12.6 09/07/2017 1503   LDLCALC 74 09/07/2017 1503    CBC    Component Value Date/Time   WBC 4.7 09/07/2017 1503   RBC 4.70 09/07/2017 1503   HGB 13.1 09/07/2017 1503   HCT 40.4 09/07/2017 1503   PLT 206.0 09/07/2017 1503   MCV 86.0 09/07/2017 1503   MCH 24.4 (L) 10/22/2011 1118   MCHC 32.5 09/07/2017 1503   RDW 14.4 09/07/2017 1503   LYMPHSABS 1.5 07/11/2016 1544   MONOABS 0.3 07/11/2016 1544   EOSABS 0.1 07/11/2016 1544   BASOSABS 0.0 07/11/2016 1544    Hgb A1C No results found for: HGBA1C         Assessment & Plan:    Preventative Health Maintenance:  Encouraged her to get a flu shot in the fall Tetanus UTD She no longer needs pap smear, only pelvic exams every 5 years Mammogram UTD  Encouraged her to consume a balanced diet and exercise regimen Advised her to see an eye doctor and dentist annually Will check CBC, CMET. Lipid and Vit D  RTC in 1 year, sooner if needed Webb Silversmith, NP

## 2019-03-28 NOTE — Patient Instructions (Signed)
Health Maintenance, Female Adopting a healthy lifestyle and getting preventive care are important in promoting health and wellness. Ask your health care provider about:  The right schedule for you to have regular tests and exams.  Things you can do on your own to prevent diseases and keep yourself healthy. What should I know about diet, weight, and exercise? Eat a healthy diet   Eat a diet that includes plenty of vegetables, fruits, low-fat dairy products, and lean protein.  Do not eat a lot of foods that are high in solid fats, added sugars, or sodium. Maintain a healthy weight Body mass index (BMI) is used to identify weight problems. It estimates body fat based on height and weight. Your health care provider can help determine your BMI and help you achieve or maintain a healthy weight. Get regular exercise Get regular exercise. This is one of the most important things you can do for your health. Most adults should:  Exercise for at least 150 minutes each week. The exercise should increase your heart rate and make you sweat (moderate-intensity exercise).  Do strengthening exercises at least twice a week. This is in addition to the moderate-intensity exercise.  Spend less time sitting. Even light physical activity can be beneficial. Watch cholesterol and blood lipids Have your blood tested for lipids and cholesterol at 46 years of age, then have this test every 5 years. Have your cholesterol levels checked more often if:  Your lipid or cholesterol levels are high.  You are older than 46 years of age.  You are at high risk for heart disease. What should I know about cancer screening? Depending on your health history and family history, you may need to have cancer screening at various ages. This may include screening for:  Breast cancer.  Cervical cancer.  Colorectal cancer.  Skin cancer.  Lung cancer. What should I know about heart disease, diabetes, and high blood  pressure? Blood pressure and heart disease  High blood pressure causes heart disease and increases the risk of stroke. This is more likely to develop in people who have high blood pressure readings, are of African descent, or are overweight.  Have your blood pressure checked: ? Every 3-5 years if you are 18-39 years of age. ? Every year if you are 40 years old or older. Diabetes Have regular diabetes screenings. This checks your fasting blood sugar level. Have the screening done:  Once every three years after age 40 if you are at a normal weight and have a low risk for diabetes.  More often and at a younger age if you are overweight or have a high risk for diabetes. What should I know about preventing infection? Hepatitis B If you have a higher risk for hepatitis B, you should be screened for this virus. Talk with your health care provider to find out if you are at risk for hepatitis B infection. Hepatitis C Testing is recommended for:  Everyone born from 1945 through 1965.  Anyone with known risk factors for hepatitis C. Sexually transmitted infections (STIs)  Get screened for STIs, including gonorrhea and chlamydia, if: ? You are sexually active and are younger than 46 years of age. ? You are older than 46 years of age and your health care provider tells you that you are at risk for this type of infection. ? Your sexual activity has changed since you were last screened, and you are at increased risk for chlamydia or gonorrhea. Ask your health care provider if   you are at risk.  Ask your health care provider about whether you are at high risk for HIV. Your health care provider may recommend a prescription medicine to help prevent HIV infection. If you choose to take medicine to prevent HIV, you should first get tested for HIV. You should then be tested every 3 months for as long as you are taking the medicine. Pregnancy  If you are about to stop having your period (premenopausal) and  you may become pregnant, seek counseling before you get pregnant.  Take 400 to 800 micrograms (mcg) of folic acid every day if you become pregnant.  Ask for birth control (contraception) if you want to prevent pregnancy. Osteoporosis and menopause Osteoporosis is a disease in which the bones lose minerals and strength with aging. This can result in bone fractures. If you are 65 years old or older, or if you are at risk for osteoporosis and fractures, ask your health care provider if you should:  Be screened for bone loss.  Take a calcium or vitamin D supplement to lower your risk of fractures.  Be given hormone replacement therapy (HRT) to treat symptoms of menopause. Follow these instructions at home: Lifestyle  Do not use any products that contain nicotine or tobacco, such as cigarettes, e-cigarettes, and chewing tobacco. If you need help quitting, ask your health care provider.  Do not use street drugs.  Do not share needles.  Ask your health care provider for help if you need support or information about quitting drugs. Alcohol use  Do not drink alcohol if: ? Your health care provider tells you not to drink. ? You are pregnant, may be pregnant, or are planning to become pregnant.  If you drink alcohol: ? Limit how much you use to 0-1 drink a day. ? Limit intake if you are breastfeeding.  Be aware of how much alcohol is in your drink. In the [place] one drink equals one 12 oz bottle of beer (355 mL), one 5 oz glass of wine (148 mL), or one 1 oz glass of hard liquor (44 mL). General instructions  Schedule regular health, dental, and eye exams.  Stay current with your vaccines.  Tell your health care provider if: ? You often feel depressed. ? You have ever been abused or do not feel safe at home. Summary  Adopting a healthy lifestyle and getting preventive care are important in promoting health and wellness.  Follow your health care provider's instructions about healthy  diet, exercising, and getting tested or screened for diseases.  Follow your health care provider's instructions on monitoring your cholesterol and blood pressure. This information is not intended to replace advice given to you by your health care provider. Make sure you discuss any questions you have with your health care provider. Document Released: 03/07/2011 Document Revised: 08/15/2018 Document Reviewed: 08/15/2018 Elsevier Patient Education  2020 [employer]  

## 2019-03-28 NOTE — Assessment & Plan Note (Signed)
Currently well managed on Lisinopril/HCTZ CMP today.   Reinforced DASH diet and increasing exercise. Will monitor.

## 2019-03-28 NOTE — Assessment & Plan Note (Signed)
Currently managed with Tums PRN. No upper GI on file. CBC, CMP today.

## 2019-03-29 LAB — VITAMIN D 25 HYDROXY (VIT D DEFICIENCY, FRACTURES): VITD: 11.33 ng/mL — ABNORMAL LOW (ref 30.00–100.00)

## 2019-03-29 LAB — COMPREHENSIVE METABOLIC PANEL
ALT: 31 U/L (ref 0–35)
AST: 28 U/L (ref 0–37)
Albumin: 4.2 g/dL (ref 3.5–5.2)
Alkaline Phosphatase: 78 U/L (ref 39–117)
BUN: 13 mg/dL (ref 6–23)
CO2: 26 mEq/L (ref 19–32)
Calcium: 9.7 mg/dL (ref 8.4–10.5)
Chloride: 104 mEq/L (ref 96–112)
Creatinine, Ser: 0.99 mg/dL (ref 0.40–1.20)
GFR: 72.97 mL/min (ref >60.00)
Glucose, Bld: 79 mg/dL (ref 70–99)
Potassium: 3.8 mEq/L (ref 3.5–5.1)
Sodium: 138 mEq/L (ref 135–145)
Total Bilirubin: 0.7 mg/dL (ref 0.2–1.2)
Total Protein: 6.7 g/dL (ref 6.0–8.3)

## 2019-03-29 LAB — LIPID PANEL
Cholesterol: 140 mg/dL (ref 0–200)
HDL: 57.5 mg/dL (ref >39.00)
LDL Cholesterol: 74 mg/dL (ref 0–99)
NonHDL: 82.49
Total CHOL/HDL Ratio: 2
Triglycerides: 40 mg/dL (ref 0.0–149.0)
VLDL: 8 mg/dL (ref 0.0–40.0)

## 2019-03-29 LAB — CBC
HCT: 36.1 % (ref 36.0–46.0)
Hemoglobin: 11.8 g/dL — ABNORMAL LOW (ref 12.0–15.0)
MCHC: 32.6 g/dL (ref 30.0–36.0)
MCV: 85.9 fl (ref 78.0–100.0)
Platelets: 200 10*3/uL (ref 150.0–400.0)
RBC: 4.2 Mil/uL (ref 3.87–5.11)
RDW: 13.5 % (ref 11.5–15.5)
WBC: 3.8 10*3/uL — ABNORMAL LOW (ref 4.0–10.5)

## 2019-03-29 MED ORDER — VITAMIN D (ERGOCALCIFEROL) 1.25 MG (50000 UNIT) PO CAPS: 50000.0000 [IU] | ORAL_CAPSULE | ORAL | 0 refills | Status: DC

## 2019-04-07 ENCOUNTER — Other Ambulatory Visit: Payer: Self-pay | Admitting: Internal Medicine

## 2019-09-05 ENCOUNTER — Other Ambulatory Visit: Payer: Self-pay | Admitting: Obstetrics

## 2019-09-05 DIAGNOSIS — K581 Irritable bowel syndrome with constipation: Secondary | ICD-10-CM

## 2019-09-10 ENCOUNTER — Other Ambulatory Visit: Payer: Self-pay

## 2019-09-10 DIAGNOSIS — Z20822 Contact with and (suspected) exposure to covid-19: Secondary | ICD-10-CM

## 2019-09-11 LAB — NOVEL CORONAVIRUS, NAA: SARS-CoV-2, NAA: NOT DETECTED

## 2019-10-01 ENCOUNTER — Encounter: Payer: Self-pay | Admitting: Obstetrics

## 2019-10-01 ENCOUNTER — Ambulatory Visit (INDEPENDENT_AMBULATORY_CARE_PROVIDER_SITE_OTHER): Payer: 59 | Admitting: Obstetrics

## 2019-10-01 ENCOUNTER — Other Ambulatory Visit: Payer: Self-pay

## 2019-10-01 VITALS — BP 143/93 | HR 72 | Ht 66.5 in | Wt 160.9 lb

## 2019-10-01 DIAGNOSIS — N898 Other specified noninflammatory disorders of vagina: Secondary | ICD-10-CM | POA: Diagnosis not present

## 2019-10-01 DIAGNOSIS — Z113 Encounter for screening for infections with a predominantly sexual mode of transmission: Secondary | ICD-10-CM | POA: Diagnosis not present

## 2019-10-01 DIAGNOSIS — Z90710 Acquired absence of both cervix and uterus: Secondary | ICD-10-CM

## 2019-10-01 DIAGNOSIS — Z01419 Encounter for gynecological examination (general) (routine) without abnormal findings: Secondary | ICD-10-CM

## 2019-10-01 DIAGNOSIS — K581 Irritable bowel syndrome with constipation: Secondary | ICD-10-CM

## 2019-10-01 MED ORDER — DICYCLOMINE HCL 10 MG PO CAPS: 10.0000 mg | ORAL_CAPSULE | Freq: Three times a day (TID) | ORAL | 11 refills | Status: DC

## 2019-10-01 NOTE — Progress Notes (Addendum)
Subjective:        Michele Gilmore is a 49 y.o. female here for a routine exam.  Current complaints: None.    Personal health questionnaire:  Is patient Ashkenazi Jewish, have a family history of breast and/or ovarian cancer: no Is there a family history of uterine cancer diagnosed at age < 80, gastrointestinal cancer, urinary tract cancer, family member who is a Field seismologist syndrome-associated carrier: no Is the patient overweight and hypertensive, family history of diabetes, personal history of gestational diabetes, preeclampsia or PCOS: no Is patient over 45, have PCOS,  family history of premature CHD under age 40, diabetes, smoke, have hypertension or peripheral artery disease:  no At any time, has a partner hit, kicked or otherwise hurt or frightened you?: no Over the past 2 weeks, have you felt down, depressed or hopeless?: no Over the past 2 weeks, have you felt little interest or pleasure in doing things?:no   Gynecologic History Patient's last menstrual period was 10/14/2011. Contraception: status post hysterectomy Last Pap: unknown. Results were: normal Last mammogram: 02-26-2019. Results were: normal  Obstetric History OB History  Gravida Para Term Preterm AB Living  5 3 3  0 2 3  SAB TAB Ectopic Multiple Live Births  2 0 0 0 3    # Outcome Date GA Lbr Len/2nd Weight Sex Delivery Anes PTL Lv  5 Term 05/09/01 [redacted]w[redacted]d  7 lb 12 oz (3.515 kg) M Vag-Spont None  LIV  4 SAB 2000 [redacted]w[redacted]d       DEC  3 Term 08/20/92 [redacted]w[redacted]d  7 lb 14 oz (3.572 kg) F Vag-Spont None  LIV  2 SAB 1992 [redacted]w[redacted]d       DEC  1 Term 01/28/89   7 lb 5 oz (3.317 kg) M Vag-Spont None  LIV    Past Medical History:  Diagnosis Date  . Cataract   . Heart murmur    as child, no problem  . Hypertension     Past Surgical History:  Procedure Laterality Date  . ABDOMINAL HYSTERECTOMY  10/21/2011   Procedure: HYSTERECTOMY ABDOMINAL;  Surgeon: Shelly Bombard, MD;  Location: [facility];  Service: Gynecology;  Laterality:  N/A;  Right oophorectomy  . EYE SURGERY  2013   [facility]   . SVD     x 3  . TUBAL LIGATION  06/2001  . WISDOM TOOTH EXTRACTION       Current Outpatient Medications:  .  cyanocobalamin ([facility] VITAMIN B12) 1000 MCG tablet, Take 2,000 mcg by mouth daily., Disp: , Rfl:  .  dicyclomine (BENTYL) 20 MG tablet, Take 1 tablet (20 mg total) by mouth every 8 (eight) hours., Disp: 90 tablet, Rfl: 11 .  lisinopril-hydrochlorothiazide (ZESTORETIC) 20-25 MG tablet, TAKE 1 TABLET BY MOUTH  DAILY, Disp: 90 tablet, Rfl: 2 .  meloxicam (MOBIC) 15 MG tablet, Take 1 tablet (15 mg total) by mouth daily., Disp: 90 tablet, Rfl: 3 .  Omega-3 Fatty Acids (FISH OIL) 1200 MG CAPS, Take one by mouth daily, Disp: , Rfl:  .  thiamine (VITAMIN B-1) 100 MG tablet, Take 100 mg by mouth daily., Disp: , Rfl:  .  Vitamin D, Ergocalciferol, (DRISDOL) 1.25 MG (50000 UT) CAPS capsule, Take 1 capsule (50,000 Units total) by mouth every 7 (seven) days., Disp: 12 capsule, Rfl: 0 .  dicyclomine (BENTYL) 10 MG capsule, Take 1 capsule (10 mg total) by mouth 3 (three) times daily before meals., Disp: 90 capsule, Rfl: 11 .  polyethylene glycol powder (GLYCOLAX/MIRALAX)  powder, Use daily as directed, Disp: 255 g, Rfl: 3 No Known Allergies  Social History   Tobacco Use  . Smoking status: Never Smoker  . Smokeless tobacco: Never Used  Substance Use Topics  . Alcohol use: Yes    Alcohol/week: 0.0 standard drinks    Comment: socially    Family History  Problem Relation Age of Onset  . Hypertension Mother   . Stroke Mother   . Hypertension Father   . Prostate cancer Maternal Grandfather   . Pancreatic cancer Maternal Uncle       Review of Systems  Constitutional: negative for fatigue and weight loss Respiratory: negative for cough and wheezing Cardiovascular: negative for chest pain, fatigue and palpitations Gastrointestinal: negative for abdominal pain and change in bowel habits Musculoskeletal:negative for  myalgias Neurological: negative for gait problems and tremors Behavioral/Psych: negative for abusive relationship, depression Endocrine: negative for temperature intolerance    Genitourinary:negative for abnormal menstrual periods, genital lesions, hot flashes, sexual problems and vaginal discharge Integument/breast: negative for breast lump, breast tenderness, nipple discharge and skin lesion(s)    Objective:       BP (!) 143/93   Pulse 72   Ht 5' 6.5" (1.689 m)   Wt 160 lb 14.4 oz (73 kg)   LMP 10/14/2011   BMI 25.58 kg/m  General:   alert  Skin:   no rash or abnormalities  Lungs:   clear to auscultation bilaterally  Heart:   regular rate and rhythm, S1, S2 normal, no murmur, click, rub or gallop  Breasts:   normal without suspicious masses, skin or nipple changes or axillary nodes  Abdomen:  normal findings: no organomegaly, soft, non-tender and no hernia  Pelvis:  External genitalia: normal general appearance Urinary system: urethral meatus normal and bladder without fullness, nontender Vaginal: normal without tenderness, induration or masses Cervix: absent Adnexa: normal bimanual exam Uterus: absent   Lab Review Urine pregnancy test Labs reviewed yes Radiologic studies reviewed yes  50% of 25 min visit spent on counseling and coordination of care.   Assessment:     1. Encounter for gynecological examination  2. Status post abdominal hysterectomy  3. Vaginal discharge Rx: - Cervicovaginal ancillary only  4. Irritable bowel syndrome with constipation Rx: - dicyclomine (BENTYL) 10 MG capsule; Take 1 capsule (10 mg total) by mouth 3 (three) times daily before meals.  Dispense: 90 capsule; Refill: 11    Plan:    Follow up in: 1 year.   Meds ordered this encounter  Medications  . dicyclomine (BENTYL) 10 MG capsule    Sig: Take 1 capsule (10 mg total) by mouth 3 (three) times daily before meals.    Dispense:  90 capsule    Refill:  11     Shelly Bombard, MD 10/01/2019 4:09 PM

## 2019-10-01 NOTE — Progress Notes (Signed)
Pt presents for annual & pap.  Normal Mammogram 12/2018 STD testing offered, pt declined.

## 2019-10-02 LAB — CERVICOVAGINAL ANCILLARY ONLY
Bacterial Vaginitis (gardnerella): NEGATIVE
Candida Glabrata: NEGATIVE
Candida Vaginitis: NEGATIVE
Chlamydia: NEGATIVE
Comment: NEGATIVE
Comment: NEGATIVE
Comment: NEGATIVE
Comment: NEGATIVE
Comment: NEGATIVE
Comment: NORMAL
Neisseria Gonorrhea: NEGATIVE
Trichomonas: NEGATIVE

## 2019-10-25 ENCOUNTER — Other Ambulatory Visit: Payer: Self-pay | Admitting: Internal Medicine

## 2020-03-30 ENCOUNTER — Other Ambulatory Visit: Payer: Self-pay | Admitting: Internal Medicine

## 2020-03-30 DIAGNOSIS — Z1231 Encounter for screening mammogram for malignant neoplasm of breast: Secondary | ICD-10-CM

## 2020-04-07 ENCOUNTER — Ambulatory Visit
Admission: RE | Admit: 2020-04-07 | Discharge: 2020-04-07 | Disposition: A | Discharge status: Home / Self Care | Payer: 59 | Source: Ambulatory Visit | Attending: Internal Medicine | Admitting: Internal Medicine

## 2020-04-07 ENCOUNTER — Other Ambulatory Visit: Payer: Self-pay

## 2020-04-07 DIAGNOSIS — Z1231 Encounter for screening mammogram for malignant neoplasm of breast: Secondary | ICD-10-CM

## 2020-04-20 ENCOUNTER — Other Ambulatory Visit: Payer: Self-pay | Admitting: Internal Medicine

## 2020-05-21 ENCOUNTER — Encounter: Payer: 59 | Admitting: Internal Medicine

## 2020-07-16 ENCOUNTER — Other Ambulatory Visit: Payer: Self-pay | Admitting: Internal Medicine

## 2020-07-17 ENCOUNTER — Other Ambulatory Visit: Payer: Self-pay

## 2020-07-17 ENCOUNTER — Ambulatory Visit (INDEPENDENT_AMBULATORY_CARE_PROVIDER_SITE_OTHER): Payer: 59 | Admitting: Internal Medicine

## 2020-07-17 ENCOUNTER — Encounter: Payer: Self-pay | Admitting: Internal Medicine

## 2020-07-17 VITALS — BP 136/92 | HR 65 | Temp 98.0°F | Ht 65.5 in | Wt 157.0 lb

## 2020-07-17 DIAGNOSIS — K581 Irritable bowel syndrome with constipation: Secondary | ICD-10-CM | POA: Diagnosis not present

## 2020-07-17 DIAGNOSIS — Z0000 Encounter for general adult medical examination without abnormal findings: Secondary | ICD-10-CM | POA: Diagnosis not present

## 2020-07-17 DIAGNOSIS — Z1211 Encounter for screening for malignant neoplasm of colon: Secondary | ICD-10-CM

## 2020-07-17 DIAGNOSIS — I10 Essential (primary) hypertension: Secondary | ICD-10-CM | POA: Diagnosis not present

## 2020-07-17 DIAGNOSIS — M1711 Unilateral primary osteoarthritis, right knee: Secondary | ICD-10-CM

## 2020-07-17 MED ORDER — MELOXICAM 15 MG PO TABS: 15.0000 mg | ORAL_TABLET | Freq: Every day | ORAL | 0 refills | Status: DC

## 2020-07-17 NOTE — Progress Notes (Signed)
Subjective:    Patient ID: Michele Gilmore, female    DOB: 07-20-1971, 49 y.o.   MRN: [id number]  HPI  Pt presents to the clinic today for her annual exam. She is also due to follow up chronic conditions.  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 She takes Meloxicam as needed with some relief of symptoms.  IBS: Managed with Bentyl and Mirilax. She does not follow with GI.  HTN: Her BP today is 132/92. She is taking Lisinopril HCT as prescribed.  ECG from 10/2011 reviewed.  Flu: 06/2019 Tetanus: 09/2017 Covid: Pfizer Pap Smear: 12/2016, hysterectomy Mammogram: 04/2020 Colon Screening: never Vision Screening: annually Dentist: biannually  Diet: She does eat meat. She consume fruits and veggies daily. She tries to avoid fried foods. Exercise: None  Review of Systems      Past Medical History:  Diagnosis Date  . Cataract   . Heart murmur    as child, no problem  . Hypertension     Current Outpatient Medications  Medication Sig Dispense Refill  . cyanocobalamin ([facility] VITAMIN B12) 1000 MCG tablet Take 2,000 mcg by mouth daily.    Marland Kitchen dicyclomine (BENTYL) 10 MG capsule Take 1 capsule (10 mg total) by mouth 3 (three) times daily before meals. 90 capsule 11  . dicyclomine (BENTYL) 20 MG tablet Take 1 tablet (20 mg total) by mouth every 8 (eight) hours. 90 tablet 11  . lisinopril-hydrochlorothiazide (ZESTORETIC) 20-25 MG tablet TAKE 1 TABLET BY MOUTH DAILY 30 tablet 5  . meloxicam (MOBIC) 15 MG tablet Take 1 tablet (15 mg total) by mouth daily. MUST SCHEDULE PHYSICAL EXAM 90 tablet 0  . Omega-3 Fatty Acids (FISH OIL) 1200 MG CAPS Take one by mouth daily    . polyethylene glycol powder (GLYCOLAX/MIRALAX) powder Use daily as directed 255 g 3  . thiamine (VITAMIN B-1) 100 MG tablet Take 100 mg by mouth daily.    . Vitamin D, Ergocalciferol, (DRISDOL) 1.25 MG (50000 UT) CAPS capsule Take 1 capsule (50,000 Units total) by mouth every 7 (seven) days. 12 capsule 0   No current facility-administered  medications for this visit.    No Known Allergies  Family History  Problem Relation Age of Onset  . Hypertension Mother   . Stroke Mother   . Hypertension Father   . Prostate cancer Maternal Grandfather   . Pancreatic cancer Maternal Uncle     Social History   Socioeconomic History  . Marital status: Single    Spouse name: Not on file  . Number of children: 3  . Years of education: Not on file  . Highest education level: Not on file  Occupational History  . Not on file  Tobacco Use  . Smoking status: Never Smoker  . Smokeless tobacco: Never Used  Vaping Use  . Vaping Use: Never used  Substance and Sexual Activity  . Alcohol use: Yes    Alcohol/week: 0.0 standard drinks    Comment: socially  . Drug use: No  . Sexual activity: Not Currently    Birth control/protection: Surgical    Comment: hysterectomy   Other Topics Concern  . Not on file  Social History Narrative  . Not on file   Social Determinants of Health   Financial Resource Strain:   . Difficulty of Paying Living Expenses: Not on file  Food Insecurity:   . Worried About Charity fundraiser in the Last Year: Not on file  . Ran Out of Food in the Last Year: Not on  file  Transportation Needs:   . Film/video editor (Medical): Not on file  . Lack of Transportation (Non-Medical): Not on file  Physical Activity:   . Days of Exercise per Week: Not on file  . Minutes of Exercise per Session: Not on file  Stress:   . Feeling of Stress : Not on file  Social Connections:   . Frequency of Communication with Friends and Family: Not on file  . Frequency of Social Gatherings with Friends and Family: Not on file  . Attends Religious Services: Not on file  . Active Member of Clubs or Organizations: Not on file  . Attends Archivist Meetings: Not on file  . Marital Status: Not on file  Intimate Partner Violence:   . Fear of Current or Ex-Partner: Not on file  . Emotionally Abused: Not on file  .  Physically Abused: Not on file  . Sexually Abused: Not on file     Constitutional: Denies fever, malaise, fatigue, headache or abrupt weight changes.  HEENT: Denies eye pain, eye redness, ear pain, ringing in the ears, wax buildup, runny nose, nasal congestion, bloody nose, or sore throat. Respiratory: Denies difficulty breathing, shortness of breath, cough or sputum production.   Cardiovascular: Denies chest pain, chest tightness, palpitations or swelling in the hands or feet.  Gastrointestinal: Pt reports intermittent constipation. Denies abdominal pain, bloating, diarrhea or blood in the stool.  GU: Denies urgency, frequency, pain with urination, burning sensation, blood in urine, odor or discharge. Musculoskeletal: Pt reports intermittent knee pain. Denies decrease in range of motion, difficulty with gait, muscle pain or joint swelling.  Skin: Denies redness, rashes, lesions or ulcercations.  Neurological: Denies dizziness, difficulty with memory, difficulty with speech or problems with balance and coordination.  Psych: Denies anxiety, depression, SI/HI.  No other specific complaints in a complete review of systems (except as listed in HPI above).  Objective:   Physical Exam   BP (!) 136/92   Pulse 65   Temp 98 F (36.7 C) (Temporal)   Ht 5' 5.5" (1.664 m)   Wt 157 lb (71.2 kg)   LMP 10/14/2011   SpO2 98%   BMI 25.73 kg/m   Wt Readings from Last 3 Encounters:  10/01/19 160 lb 14.4 oz (73 kg)  03/28/19 163 lb (73.9 kg)  05/16/18 154 lb (69.9 kg)    General: Appears her stated age, well developed, well nourished in NAD. Skin: Warm, dry and intact. No rashes, lesions or ulcerations noted. HEENT: Head: normal shape and size; Eyes: sclera white, no icterus, conjunctiva pink, PERRLA and EOMs intact;  Neck:  Neck supple, trachea midline. No masses, lumps or thyromegaly present.  Cardiovascular: Normal rate and rhythm. S1,S2 noted.  No murmur, rubs or gallops noted. No JVD or  BLE edema.  Pulmonary/Chest: Normal effort and positive vesicular breath sounds. No respiratory distress. No wheezes, rales or ronchi noted.  Abdomen: Soft and nontender. Normal bowel sounds. No distention or masses noted. Liver, spleen and kidneys non palpable. Musculoskeletal: Joint enlargement noted of bilateral knees. Strength 5/5 BUE/BLE. No difficulty with gait.  Neurological: Alert and oriented. Cranial nerves II-XII grossly intact. Coordination normal.  Psychiatric: Mood and affect normal. Behavior is normal. Judgment and thought content normal.   BMET    Component Value Date/Time   NA 138 03/28/2019 1559   K 3.8 03/28/2019 1559   CL 104 03/28/2019 1559   CO2 26 03/28/2019 1559   GLUCOSE 79 03/28/2019 1559   GLUCOSE 86 07/25/2006  1102   BUN 13 03/28/2019 1559   CREATININE 0.99 03/28/2019 1559   CALCIUM 9.7 03/28/2019 1559   GFRNONAA >90 10/22/2011 0512   GFRAA >90 10/22/2011 0512    Lipid Panel     Component Value Date/Time   CHOL 140 03/28/2019 1559   TRIG 40.0 03/28/2019 1559   TRIG 48 07/25/2006 1102   HDL 57.50 03/28/2019 1559   CHOLHDL 2 03/28/2019 1559   VLDL 8.0 03/28/2019 1559   LDLCALC 74 03/28/2019 1559    CBC    Component Value Date/Time   WBC 3.8 (L) 03/28/2019 1559   RBC 4.20 03/28/2019 1559   HGB 11.8 (L) 03/28/2019 1559   HCT 36.1 03/28/2019 1559   PLT 200.0 03/28/2019 1559   MCV 85.9 03/28/2019 1559   MCH 24.4 (L) 10/22/2011 1118   MCHC 32.6 03/28/2019 1559   RDW 13.5 03/28/2019 1559   LYMPHSABS 1.5 07/11/2016 1544   MONOABS 0.3 07/11/2016 1544   EOSABS 0.1 07/11/2016 1544   BASOSABS 0.0 07/11/2016 1544    Hgb A1C No results found for: HGBA1C         Assessment & Plan:   Preventative Health Maintenance:  Flu shot today Tetanus UTD Covid vaccine UTD She no longer needs pap smears  Mammogram UTD Referral to GI for screening colonoscopy Encouraged her to consume a balanced diet and exercise regimen Advised her to see an eye  doctor and dentist annually Will check CBC, CMET, Lipid,and V it D today  RTC in 1 year, sooner if needed  Webb Silversmith, NP This visit occurred during the SARS-CoV-2 public health emergency.  Safety protocols were in place, including screening questions prior to the visit, additional usage of staff PPE, and extensive cleaning of exam room while observing appropriate contact time as indicated for disinfecting solutions.

## 2020-07-18 LAB — COMPREHENSIVE METABOLIC PANEL
AG Ratio: 1.4 (calc) (ref 1.0–2.5)
ALT: 13 U/L (ref 6–29)
AST: 21 U/L (ref 10–35)
Albumin: 4.2 g/dL (ref 3.6–5.1)
Alkaline phosphatase (APISO): 67 U/L (ref 31–125)
BUN: 14 mg/dL (ref 7–25)
CO2: 25 mmol/L (ref 20–32)
Calcium: 10.1 mg/dL (ref 8.6–10.2)
Chloride: 103 mmol/L (ref 98–110)
Creat: 1.1 mg/dL (ref 0.50–1.10)
Globulin: 2.9 g/dL (calc) (ref 1.9–3.7)
Glucose, Bld: 77 mg/dL (ref 65–99)
Potassium: 4 mmol/L (ref 3.5–5.3)
Sodium: 140 mmol/L (ref 135–146)
Total Bilirubin: 0.7 mg/dL (ref 0.2–1.2)
Total Protein: 7.1 g/dL (ref 6.1–8.1)

## 2020-07-18 LAB — LIPID PANEL
Cholesterol: 156 mg/dL (ref <200)
HDL: 79 mg/dL (ref >=50)
LDL Cholesterol (Calc): 65 mg/dL (calc)
Non-HDL Cholesterol (Calc): 77 mg/dL (calc) (ref <130)
Total CHOL/HDL Ratio: 2 (calc) (ref <5.0)
Triglycerides: 43 mg/dL (ref <150)

## 2020-07-18 LAB — CBC
HCT: 40.5 % (ref 35.0–45.0)
Hemoglobin: 12.7 g/dL (ref 11.7–15.5)
MCH: 26.9 pg — ABNORMAL LOW (ref 27.0–33.0)
MCHC: 31.4 g/dL — ABNORMAL LOW (ref 32.0–36.0)
MCV: 85.8 fL (ref 80.0–100.0)
MPV: 10.7 fL (ref 7.5–12.5)
Platelets: 240 10*3/uL (ref 140–400)
RBC: 4.72 10*6/uL (ref 3.80–5.10)
RDW: 12.7 % (ref 11.0–15.0)
WBC: 4.5 10*3/uL (ref 3.8–10.8)

## 2020-07-18 LAB — VITAMIN D 25 HYDROXY (VIT D DEFICIENCY, FRACTURES): Vit D, 25-Hydroxy: 37 ng/mL (ref 30–100)

## 2020-07-20 ENCOUNTER — Encounter: Payer: Self-pay | Admitting: Internal Medicine

## 2020-07-20 ENCOUNTER — Telehealth: Payer: Self-pay

## 2020-07-20 NOTE — Patient Instructions (Signed)
Health Maintenance, Female Adopting a healthy lifestyle and getting preventive care are important in promoting health and wellness. Ask your health care provider about:  The right schedule for you to have regular tests and exams.  Things you can do on your own to prevent diseases and keep yourself healthy. What should I know about diet, weight, and exercise? Eat a healthy diet   Eat a diet that includes plenty of vegetables, fruits, low-fat dairy products, and lean protein.  Do not eat a lot of foods that are high in solid fats, added sugars, or sodium. Maintain a healthy weight Body mass index (BMI) is used to identify weight problems. It estimates body fat based on height and weight. Your health care provider can help determine your BMI and help you achieve or maintain a healthy weight. Get regular exercise Get regular exercise. This is one of the most important things you can do for your health. Most adults should:  Exercise for at least 150 minutes each week. The exercise should increase your heart rate and make you sweat (moderate-intensity exercise).  Do strengthening exercises at least twice a week. This is in addition to the moderate-intensity exercise.  Spend less time sitting. Even light physical activity can be beneficial. Watch cholesterol and blood lipids Have your blood tested for lipids and cholesterol at 47 years of age, then have this test every 5 years. Have your cholesterol levels checked more often if:  Your lipid or cholesterol levels are high.  You are older than 47 years of age.  You are at high risk for heart disease. What should I know about cancer screening? Depending on your health history and family history, you may need to have cancer screening at various ages. This may include screening for:  Breast cancer.  Cervical cancer.  Colorectal cancer.  Skin cancer.  Lung cancer. What should I know about heart disease, diabetes, and high blood  pressure? Blood pressure and heart disease  High blood pressure causes heart disease and increases the risk of stroke. This is more likely to develop in people who have high blood pressure readings, are of African descent, or are overweight.  Have your blood pressure checked: ? Every 3-5 years if you are 18-39 years of age. ? Every year if you are 40 years old or older. Diabetes Have regular diabetes screenings. This checks your fasting blood sugar level. Have the screening done:  Once every three years after age 40 if you are at a normal weight and have a low risk for diabetes.  More often and at a younger age if you are overweight or have a high risk for diabetes. What should I know about preventing infection? Hepatitis B If you have a higher risk for hepatitis B, you should be screened for this virus. Talk with your health care provider to find out if you are at risk for hepatitis B infection. Hepatitis C Testing is recommended for:  Everyone born from 1945 through 1965.  Anyone with known risk factors for hepatitis C. Sexually transmitted infections (STIs)  Get screened for STIs, including gonorrhea and chlamydia, if: ? You are sexually active and are younger than 47 years of age. ? You are older than 47 years of age and your health care provider tells you that you are at risk for this type of infection. ? Your sexual activity has changed since you were last screened, and you are at increased risk for chlamydia or gonorrhea. Ask your health care provider if   you are at risk.  Ask your health care provider about whether you are at high risk for HIV. Your health care provider may recommend a prescription medicine to help prevent HIV infection. If you choose to take medicine to prevent HIV, you should first get tested for HIV. You should then be tested every 3 months for as long as you are taking the medicine. Pregnancy  If you are about to stop having your period (premenopausal) and  you may become pregnant, seek counseling before you get pregnant.  Take 400 to 800 micrograms (mcg) of folic acid every day if you become pregnant.  Ask for birth control (contraception) if you want to prevent pregnancy. Osteoporosis and menopause Osteoporosis is a disease in which the bones lose minerals and strength with aging. This can result in bone fractures. If you are 65 years old or older, or if you are at risk for osteoporosis and fractures, ask your health care provider if you should:  Be screened for bone loss.  Take a calcium or vitamin D supplement to lower your risk of fractures.  Be given hormone replacement therapy (HRT) to treat symptoms of menopause. Follow these instructions at home: Lifestyle  Do not use any products that contain nicotine or tobacco, such as cigarettes, e-cigarettes, and chewing tobacco. If you need help quitting, ask your health care provider.  Do not use street drugs.  Do not share needles.  Ask your health care provider for help if you need support or information about quitting drugs. Alcohol use  Do not drink alcohol if: ? Your health care provider tells you not to drink. ? You are pregnant, may be pregnant, or are planning to become pregnant.  If you drink alcohol: ? Limit how much you use to 0-1 drink a day. ? Limit intake if you are breastfeeding.  Be aware of how much alcohol is in your drink. In the [place] one drink equals one 12 oz bottle of beer (355 mL), one 5 oz glass of wine (148 mL), or one 1 oz glass of hard liquor (44 mL). General instructions  Schedule regular health, dental, and eye exams.  Stay current with your vaccines.  Tell your health care provider if: ? You often feel depressed. ? You have ever been abused or do not feel safe at home. Summary  Adopting a healthy lifestyle and getting preventive care are important in promoting health and wellness.  Follow your health care provider's instructions about healthy  diet, exercising, and getting tested or screened for diseases.  Follow your health care provider's instructions on monitoring your cholesterol and blood pressure. This information is not intended to replace advice given to you by your health care provider. Make sure you discuss any questions you have with your health care provider. Document Revised: 08/15/2018 Document Reviewed: 08/15/2018 Elsevier Patient Education  2020 [employer]  

## 2020-07-20 NOTE — Assessment & Plan Note (Signed)
Encouraged weight loss Meloxicam refilled today CMET today

## 2020-07-20 NOTE — Assessment & Plan Note (Signed)
Continue Mirilax and Bentyl Referral to GI for screening colonoscopy

## 2020-07-20 NOTE — Assessment & Plan Note (Signed)
Controlled on Lisinopril HCT Reinforced DASH diet  CMET today Will monitor

## 2020-07-20 NOTE — Telephone Encounter (Signed)
Patient dropped off FMLA paperwork, asked to be called once it is done. Placed on cart up front.

## 2020-08-25 ENCOUNTER — Encounter: Payer: Self-pay | Admitting: Internal Medicine

## 2020-10-02 ENCOUNTER — Ambulatory Visit (AMBULATORY_SURGERY_CENTER): Payer: 59 | Admitting: *Deleted

## 2020-10-02 ENCOUNTER — Other Ambulatory Visit: Payer: Self-pay

## 2020-10-02 VITALS — Ht 65.5 in | Wt 154.0 lb

## 2020-10-02 DIAGNOSIS — Z1211 Encounter for screening for malignant neoplasm of colon: Secondary | ICD-10-CM

## 2020-10-02 MED ORDER — SUTAB 1479-225-188 MG PO TABS
1.0000 | ORAL_TABLET | Freq: Once | ORAL | 0 refills | Status: AC
Start: 2020-10-02 — End: 2020-10-02

## 2020-10-02 NOTE — Progress Notes (Signed)
Completed covid vaccines x2  Pt is aware that care partner will wait in the car during procedure; if they feel like they will be too hot or cold to wait in the car; they may wait in the 4 th floor lobby. Patient is aware to bring only one care partner. We want them to wear a mask (we do not have any that we can provide them), practice social distancing, and we will check their temperatures when they get here.  I did remind the patient that their care partner needs to stay in the parking lot the entire time and have a cell phone available, we will call them when the pt is ready for discharge. Patient will wear mask into building.  Pt's previsit is done over the phone and all paperwork (prep instructions, blank consent form to just read over, pre-procedure acknowledgement form and stamped envelope) sent to patient   No trouble with anesthesia, denies being told they were difficult to intubate, or hx/fam hx of malignant hyperthermia per pt   No egg or soy allergy  No home oxygen use   No medications for weight loss taken  emmi information given  Pt has constipation issues- 2 day Sutab/Miralax prep given   Sutab code put into RX and paper copy given to pt to show pharmacy

## 2020-10-16 ENCOUNTER — Encounter: Payer: 59 | Admitting: Internal Medicine

## 2020-10-28 ENCOUNTER — Encounter: Payer: Self-pay | Admitting: Gastroenterology

## 2020-10-29 ENCOUNTER — Telehealth: Payer: Self-pay | Admitting: Gastroenterology

## 2020-10-29 NOTE — Telephone Encounter (Signed)
That is unfortunate. This procedure is very overdue for her. She should be charged a cancellation fee per office policy given lack of notice for this.

## 2020-10-29 NOTE — Telephone Encounter (Signed)
Hi Dr. Havery Moros., this patient just called to reschedule her procedure that was scheduled for tomorrow. Pt stated that her hemorrhoids are very bad today so she prefers to wait. Patient has rescheduled to 11/02/20 at 9:00am. Thank you.

## 2020-10-30 ENCOUNTER — Encounter: Payer: 59 | Admitting: Gastroenterology

## 2020-11-02 ENCOUNTER — Ambulatory Visit (AMBULATORY_SURGERY_CENTER): Payer: 59 | Admitting: Gastroenterology

## 2020-11-02 ENCOUNTER — Encounter: Payer: Self-pay | Admitting: Gastroenterology

## 2020-11-02 ENCOUNTER — Other Ambulatory Visit: Payer: Self-pay

## 2020-11-02 VITALS — BP 126/86 | HR 59 | Temp 98.2°F | Resp 22 | Ht 65.5 in | Wt 154.0 lb

## 2020-11-02 DIAGNOSIS — K648 Other hemorrhoids: Secondary | ICD-10-CM

## 2020-11-02 DIAGNOSIS — K625 Hemorrhage of anus and rectum: Secondary | ICD-10-CM | POA: Diagnosis present

## 2020-11-02 MED ORDER — SODIUM CHLORIDE 0.9 % IV SOLN: 500.0000 mL | Freq: Once | INTRAVENOUS | Status: DC

## 2020-11-02 NOTE — Op Note (Signed)
[place] Patient Name: Michele Gilmore Procedure Date: 11/02/2020 8:58 AM MRN: [id number] Endoscopist: Remo Lipps P. Havery Moros , MD Age: 50 Referring MD:  Date of Birth: 1971-05-09 Gender: Female Account #: [id number] Procedure:                Colonoscopy Indications:              This is the patient's first colonoscopy, Rectal                            bleeding Medicines:                Monitored Anesthesia Care Procedure:                Pre-Anesthesia Assessment:                           - Prior to the procedure, a History and Physical                            was performed, and patient medications and                            allergies were reviewed. The patient's tolerance of                            previous anesthesia was also reviewed. The risks                            and benefits of the procedure and the sedation                            options and risks were discussed with the patient.                            All questions were answered, and informed consent                            was obtained. Prior Anticoagulants: The patient has                            taken no previous anticoagulant or antiplatelet                            agents. ASA Grade Assessment: II - A patient with                            mild systemic disease. After reviewing the risks                            and benefits, the patient was deemed in                            satisfactory condition to undergo the procedure.  After obtaining informed consent, the colonoscope                            was passed under direct vision. Throughout the                            procedure, the patient's blood pressure, pulse, and                            oxygen saturations were monitored continuously. The                            Olympus CF-HQ190 [id number]) Colonoscope was                            introduced through the anus and advanced to the the                             cecum, identified by appendiceal orifice and                            ileocecal valve. The colonoscopy was performed                            without difficulty. The patient tolerated the                            procedure well. The quality of the bowel                            preparation was good. The terminal ileum, ileocecal                            valve, appendiceal orifice, and rectum were                            photographed. Scope In: 9:13:34 AM Scope Out: 9:35:20 AM Scope Withdrawal Time: 0 hours 16 minutes 20 seconds  Total Procedure Duration: 0 hours 21 minutes 46 seconds  Findings:                 Hemorrhoids were found on perianal exam.                           The terminal ileum appeared normal.                           Internal hemorrhoids were found during retroflexion                            and were inflamed.                           The colon was tortous. The exam was otherwise  without abnormality. Complications:            No immediate complications. Estimated blood loss:                            None. Estimated Blood Loss:     Estimated blood loss: none. Impression:               - Hemorrhoids found on perianal exam.                           - The examined portion of the ileum was normal.                           - Internal hemorrhoids.                           - The examination was otherwise normal.                           - No polyps.                           Hemorrhoids are the likely cause of bleeding                            symptoms in the setting of constipation. Recommendation:           - Patient has a contact number available for                            emergencies. The signs and symptoms of potential                            delayed complications were discussed with the                            patient. Return to normal activities tomorrow.                             Written discharge instructions were provided to the                            patient.                           - Resume previous diet.                           - Continue present medications.                           - Miralax daily or higher dose if needed, titrate                            as needed, or consideration of other causes to  treat constipation. Consider hemorrhoid banding if                            hemorrhoid symptoms persist over time                           - Repeat colonoscopy in 10 years for screening                            purposes. Remo Lipps P. Havery Moros, MD 11/02/2020 9:39:43 AM This report has been signed electronically.

## 2020-11-02 NOTE — Progress Notes (Signed)
Report to PACU, RN, vss, BBS= Clear.  

## 2020-11-02 NOTE — Patient Instructions (Signed)
YOU HAD AN ENDOSCOPIC PROCEDURE TODAY AT [facility]:   Refer to the procedure report that was given to you for any specific questions about what was found during the examination.  If the procedure report does not answer your questions, please call your gastroenterologist to clarify.  If you requested that your care partner not be given the details of your procedure findings, then the procedure report has been included in a sealed envelope for you to review at your convenience later.  **Handout given on Hemorrhoids and hemorrhoid banding**  YOU SHOULD EXPECT: Some feelings of bloating in the abdomen. Passage of more gas than usual.  Walking can help get rid of the air that was put into your GI tract during the procedure and reduce the bloating. If you had a lower endoscopy (such as a colonoscopy or flexible sigmoidoscopy) you may notice spotting of blood in your stool or on the toilet paper. If you underwent a bowel prep for your procedure, you may not have a normal bowel movement for a few days.  Please Note:  You might notice some irritation and congestion in your nose or some drainage.  This is from the oxygen used during your procedure.  There is no need for concern and it should clear up in a day or so.  SYMPTOMS TO REPORT IMMEDIATELY:   Following lower endoscopy (colonoscopy or flexible sigmoidoscopy):  Excessive amounts of blood in the stool  Significant tenderness or worsening of abdominal pains  Swelling of the abdomen that is new, acute  Fever of 100F or higher  For urgent or emergent issues, a gastroenterologist can be reached at any hour by calling [phone]. Do not use MyChart messaging for urgent concerns.    DIET:  We do recommend a small meal at first, but then you may proceed to your regular diet.  Drink plenty of fluids but you should avoid alcoholic beverages for 24 hours.  ACTIVITY:  You should plan to take it easy for the rest of today and you should  NOT DRIVE or use heavy machinery until tomorrow (because of the sedation medicines used during the test).    FOLLOW UP: Our staff will call the number listed on your records 48-72 hours following your procedure to check on you and address any questions or concerns that you may have regarding the information given to you following your procedure. If we do not reach you, we will leave a message.  We will attempt to reach you two times.  During this call, we will ask if you have developed any symptoms of COVID 19. If you develop any symptoms (ie: fever, flu-like symptoms, shortness of breath, cough etc.) before then, please call [phone].  If you test positive for Covid 19 in the 2 weeks post procedure, please call and report this information to [place].    If any biopsies were taken you will be contacted by phone or by letter within the next 1-3 weeks.  Please call us at [phone] if you have not heard about the biopsies in 3 weeks.    SIGNATURES/CONFIDENTIALITY: You and/or your care partner have signed paperwork which will be entered into your electronic medical record.  These signatures attest to the fact that that the information above on your After Visit Summary has been reviewed and is understood.  Full responsibility of the confidentiality of this discharge information lies with you and/or your care-partner.

## 2020-11-04 ENCOUNTER — Telehealth: Payer: Self-pay

## 2020-11-04 NOTE — Telephone Encounter (Incomplete)
Enter in error

## 2020-11-17 ENCOUNTER — Telehealth: Payer: Self-pay | Admitting: Gastroenterology

## 2020-11-17 MED ORDER — LINACLOTIDE 145 MCG PO CAPS: 145.0000 ug | ORAL_CAPSULE | Freq: Every day | ORAL | 1 refills | Status: DC

## 2020-11-17 NOTE — Telephone Encounter (Signed)
Called and spoke to patient. She is taking Linzess 145 mcg and uses the [facility] on [address].  New prescription sent #90 RF1

## 2020-11-17 NOTE — Telephone Encounter (Signed)
Dr. Havery Moros - OK to send a prescription for Linzess? I do not see that you prescribed however, perhaps she was given samples at the time of her procedure?  Please advise. Thank you

## 2020-11-17 NOTE — Telephone Encounter (Signed)
Pt is requesting a prescription for Linzess since the medication worked well for her.

## 2020-11-17 NOTE — Telephone Encounter (Signed)
We can refill this for her if using it. She will need to clarify dosing. If she is not sure can try Linzess 140mcg once daily as needed. #90 RF1.  Thanks

## 2020-11-18 ENCOUNTER — Telehealth: Payer: Self-pay

## 2020-11-18 NOTE — Telephone Encounter (Signed)
Duplicate. Error

## 2020-11-18 NOTE — Telephone Encounter (Signed)
Request Reference Number: [id number]. LINZESS CAP 145MCG is approved through 11/18/2021.

## 2020-11-18 NOTE — Telephone Encounter (Signed)
Prior Authorization for Linzess 145 mcgs submitted via CoverMyMeds.

## 2020-12-15 NOTE — Telephone Encounter (Signed)
error 

## 2021-07-08 ENCOUNTER — Other Ambulatory Visit: Payer: Self-pay | Admitting: Internal Medicine

## 2021-07-08 DIAGNOSIS — Z1231 Encounter for screening mammogram for malignant neoplasm of breast: Secondary | ICD-10-CM

## 2021-08-09 ENCOUNTER — Telehealth: Payer: Self-pay

## 2021-08-09 ENCOUNTER — Ambulatory Visit: Payer: Self-pay | Admitting: *Deleted

## 2021-08-09 NOTE — Telephone Encounter (Signed)
I opened chart to answer a question for the agent.   Pt has been triaged by Emelia Salisbury, LPN and given instructions from [facility].   Pt has been instructed to go to the urgent care which she has agreed to do at 3:00 today when she gets off of work.    Pt used to see Webb Silversmith, NP at that practice prior to Webb Silversmith going to the [employer].    I let agent know if pt wants to stay with Webb Silversmith that making her an appt would be fine but she did not need to be triaged again.

## 2021-08-09 NOTE — Telephone Encounter (Signed)
Unable to reach pt or pts mom (DPR signed).

## 2021-08-09 NOTE — Telephone Encounter (Signed)
Sending to another doctor in the office as a [place].

## 2021-08-09 NOTE — Telephone Encounter (Signed)
Spoke to patient by telephone and was advised that she is at work. Patient stated that she thinks that she may have acid reflux. Patient stated that she was not aware that Webb Silversmith NP had left the office. Patient stated that she will probably follow Rollene Fare to her new office. Patient was advised that we do not have any appointments available today. Patient was advised with her symptoms she should be evaluated today. Patient was given information on the Urgent Care at [facility]. Patient stated that she will go to the Urgent Care around 3:00 pm today when she gets off from work. Patient was given ER precautions and she verbalized understanding.

## 2021-08-09 NOTE — Telephone Encounter (Signed)
[place] Day - Client TELEPHONE ADVICE RECORD [facility] Patient Name: Michele Gilmore [facility] Gender: Female DOB: 03-Sep-1971 Age: 50 Y 48 M 2 D Return Phone Number: [phone] (Secondary) Address: City/ State/ Zip: [place]  [address] Client [facility] [facility] - Client Client Site [place] - Day Provider AA - PHYSICIAN, Verita Schneiders- MD Contact Type Call Who Is Calling Patient / Member / Family / Caregiver Call Type Triage / Clinical Relationship To Patient Self Return Phone Number [phone] (Secondary) Chief Complaint CHEST PAIN - pain, pressure, heaviness or tightness Reason for Call Symptomatic / Request for Health Information Initial Comment Having chest pains, tightness and shortness of breath. Cant get a full breath. Translation No Nurse Assessment Nurse: Vallery Sa, RN, Cathy Date/Time (Eastern Time): 08/09/2021 9:52:00 AM Confirm and document reason for call. If symptomatic, describe symptoms. ---[name] states that she developed Flu-symptoms several weeks ago. She developed chest pain (current pain rated as a 4 on the 1 to 10 scale) and shortness of breath about 4 days ago. Alert and responsive. Does the patient have any new or worsening symptoms? ---Yes Will a triage be completed? ---Yes Related visit to physician within the last 2 weeks? ---No Does the PT have any chronic conditions? (i.e. diabetes, asthma, this includes High risk factors for pregnancy, etc.) ---Yes List chronic conditions. ---High Blood Pressure, Acid Reflux? Is the patient pregnant or possibly pregnant? (Ask all females between the ages of 32-55) ---No Is this a behavioral health or substance abuse call? ---No Guidelines Guideline Title Affirmed Question Affirmed Notes Nurse Date/Time Eilene Ghazi Time) Chest Pain Difficulty breathing Vallery Sa, RN, [place] 08/09/2021 9:55:22 AM Disp. Time Eilene Ghazi Time) Disposition Final  User 08/09/2021 9:47:55 AM Send to Urgent Queue Hollace Kinnier NOTE: All timestamps contained within this report are represented as [place] Standard Time. CONFIDENTIALTY NOTICE: This fax transmission is intended only for the addressee. It contains information that is legally privileged, confidential or otherwise protected from use or disclosure. If you are not the intended recipient, you are strictly prohibited from reviewing, disclosing, copying using or disseminating any of this information or taking any action in reliance on or regarding this information. If you have received this fax in error, please notify us immediately by telephone so that we can arrange for its return to [place]. Phone: [phone], Toll-Free: [phone], Fax: [phone] Page: 2 of 2 Call Id: [id number] 08/09/2021 9:58:00 AM Go to ED Now Yes Vallery Sa, RN, [place]y [place] Disagree/Comply Comply Caller Understands Yes PreDisposition Call Doctor Care Advice Given Per Guideline GO TO ED NOW: * You need to be seen in the Emergency Department. * Go to the ED at ___________ [place] now. Drive carefully. NOTE TO TRIAGER - DRIVING: * Another adult should drive. * Patient should not delay going to the emergency department. * If immediate transportation is not available via car, rideshare (e.g., Lyft, Uber), or taxi, then the patient should be instructed to call [id number]. NOTHING BY MOUTH: * Do not eat or drink anything for now. CALL EMS IF: * Severe difficulty breathing occurs * Passes out or becomes too weak to stand * You become worse CARE ADVICE given per Chest Pain (Adult) guideline. [place]

## 2021-08-09 NOTE — Telephone Encounter (Signed)
Pt returning call. Pt states you can reach her at [phone]

## 2021-08-09 NOTE — Telephone Encounter (Signed)
Noted. Thanks.

## 2021-08-09 NOTE — Telephone Encounter (Signed)
I was unable to speak with pt and left v/m requesting pt to cb for further info to [facility] triage.

## 2021-08-10 ENCOUNTER — Ambulatory Visit: Payer: 59

## 2021-08-11 ENCOUNTER — Ambulatory Visit: Payer: 59

## 2021-08-11 ENCOUNTER — Emergency Department (INDEPENDENT_AMBULATORY_CARE_PROVIDER_SITE_OTHER)
Admission: EM | Admit: 2021-08-11 | Discharge: 2021-08-11 | Disposition: A | Discharge status: Home / Self Care | Payer: 59 | Source: Home / Self Care

## 2021-08-11 ENCOUNTER — Emergency Department (INDEPENDENT_AMBULATORY_CARE_PROVIDER_SITE_OTHER): Payer: 59

## 2021-08-11 ENCOUNTER — Other Ambulatory Visit: Payer: Self-pay

## 2021-08-11 ENCOUNTER — Emergency Department: Admit: 2021-08-11 | Discharge status: Absent without leave | Payer: Self-pay

## 2021-08-11 DIAGNOSIS — J209 Acute bronchitis, unspecified: Secondary | ICD-10-CM

## 2021-08-11 DIAGNOSIS — R059 Cough, unspecified: Secondary | ICD-10-CM | POA: Diagnosis not present

## 2021-08-11 DIAGNOSIS — R0789 Other chest pain: Secondary | ICD-10-CM

## 2021-08-11 MED ORDER — BENZONATATE 100 MG PO CAPS: 100.0000 mg | ORAL_CAPSULE | Freq: Three times a day (TID) | ORAL | 0 refills | Status: DC

## 2021-08-11 MED ORDER — PREDNISONE 10 MG (21) PO TBPK: ORAL_TABLET | ORAL | 0 refills | Status: DC

## 2021-08-11 NOTE — ED Provider Notes (Signed)
[facility]    CSN: [id number] Arrival date & time: 08/11/21  1841      History   Chief Complaint Chief Complaint  Patient presents with   Cough    Cough, , chest pain, right arm, sob. X1 week    HPI Michele Gilmore is a 50 y.o. female.   Patient presents with concerns of persistent cough as well as feeling short of breath and chest discomfort. The patient states she thinks she had the flu 2-3 weeks ago and reports she has continued to have a cough since then. She reports the cough is dry. She has since developed some intermittent chest discomfort, across her chest as well as in her left mid back, that sometimes worsens with coughing or laying on her left side. She states she has had some shortness of breath, feeling like it's difficult to get a good deep breath. She has not taken anything for her symptoms or seen anyone for them. She did try her son's inhaler once without improvement. The patient denies fever.   The history is provided by the patient.  Cough Associated symptoms: chest pain and shortness of breath   Associated symptoms: no fever, no headaches, no rash, no sore throat and no wheezing    Past Medical History:  Diagnosis Date   Anemia    Arthritis    Cataract    Heart murmur    as child, no problem   Hypertension    IBS (irritable bowel syndrome)     Patient Active Problem List   Diagnosis Date Noted   OA (osteoarthritis) of knee 03/28/2019   IBS (irritable bowel syndrome) 03/28/2019   Allergic rhinitis 01/22/2013   Essential hypertension 11/09/2007    Past Surgical History:  Procedure Laterality Date   ABDOMINAL HYSTERECTOMY  10/21/2011   Procedure: HYSTERECTOMY ABDOMINAL;  Surgeon: Shelly Bombard, MD;  Location: [facility];  Service: Gynecology;  Laterality: N/A;  Right oophorectomy   EYE SURGERY  2013   [facility]    SVD     x 3   TUBAL LIGATION  06/2001   WISDOM TOOTH EXTRACTION      OB History     Gravida  5   Para  3    Term  3   Preterm  0   AB  2   Living  3      SAB  2   IAB  0   Ectopic  0   Multiple  0   Live Births  3            Home Medications    Prior to Admission medications   Medication Sig Start Date End Date Taking? Authorizing Provider  benzonatate (TESSALON) 100 MG capsule Take 1 capsule (100 mg total) by mouth every 8 (eight) hours. 08/11/21  Yes Eun Vermeer L, PA  cyanocobalamin 1000 MCG tablet Take 2,000 mcg by mouth daily.   Yes [provider]  Omega-3 Fatty Acids (FISH OIL) 1200 MG CAPS Take one by mouth daily   Yes [provider]  predniSONE (STERAPRED UNI-PAK 21 TAB) 10 MG (21) TBPK tablet Take as directed 08/11/21  Yes Roan Miklos L, PA  thiamine (VITAMIN B-1) 100 MG tablet Take 100 mg by mouth daily.   Yes [provider]  Cholecalciferol (EQL VITAMIN D3) 50 MCG (2000 UT) CAPS Take by mouth.    [provider]  dicyclomine (BENTYL) 10 MG capsule Take 1 capsule (10 mg total) by  mouth 3 (three) times daily before meals. Patient taking differently: Take 10 mg by mouth 3 (three) times daily before meals. PRN 10/01/19   Shelly Bombard, MD  linaclotide [facility]) 145 MCG CAPS capsule Take 1 capsule (145 mcg total) by mouth daily before breakfast. 11/17/20   Armbruster, Carlota Raspberry, MD  lisinopril-hydrochlorothiazide (ZESTORETIC) 20-25 MG tablet TAKE 1 TABLET BY MOUTH DAILY 07/17/20   Jearld Fenton, NP  meloxicam (MOBIC) 15 MG tablet Take 1 tablet (15 mg total) by mouth daily. Patient taking differently: Take 15 mg by mouth daily. Uses PRN 07/17/20   Jearld Fenton, NP  polyethylene glycol powder (GLYCOLAX/MIRALAX) powder Use daily as directed Patient not taking: Reported on 10/02/2020 12/26/17   Armbruster, Carlota Raspberry, MD    Family History Family History  Problem Relation Age of Onset   Hypertension Mother    Stroke Mother    Hypertension Father    Prostate cancer Maternal Grandfather    Pancreatic cancer Maternal Uncle     Colon cancer Neg Hx    Esophageal cancer Neg Hx    Rectal cancer Neg Hx    Stomach cancer Neg Hx     Social History Social History   Tobacco Use   Smoking status: Never   Smokeless tobacco: Never  Vaping Use   Vaping Use: Never used  Substance Use Topics   Alcohol use: Yes    Alcohol/week: 0.0 standard drinks    Comment: socially   Drug use: No     Allergies   Patient has no known allergies.   Review of Systems Review of Systems  Constitutional:  Negative for fatigue and fever.  HENT:  Negative for congestion and sore throat.   Eyes:  Negative for visual disturbance.  Respiratory:  Positive for cough, chest tightness and shortness of breath. Negative for wheezing.   Cardiovascular:  Positive for chest pain. Negative for palpitations.  Gastrointestinal:  Negative for nausea and vomiting.  Musculoskeletal:  Positive for back pain.  Skin:  Negative for rash.  Neurological:  Negative for dizziness and headaches.    Physical Exam Triage Vital Signs ED Triage Vitals  Enc Vitals Group     BP 08/11/21 1919 (!) 178/112     Pulse Rate 08/11/21 1919 60     Resp 08/11/21 1919 18     Temp 08/11/21 1919 98.4 F (36.9 C)     Temp Source 08/11/21 1919 Oral     SpO2 08/11/21 1919 100 %     Weight 08/11/21 1917 165 lb (74.8 kg)     Height 08/11/21 1917 5' 6.5" (1.689 m)     Head Circumference --      Peak Flow --      Pain Score 08/11/21 1916 4     Pain Loc --      Pain Edu? --      Excl. in [place]? --    No data found.  Updated Vital Signs BP (!) 178/112   Pulse 60   Temp 98.4 F (36.9 C) (Oral)   Resp 18   Ht 5' 6.5" (1.689 m)   Wt 165 lb (74.8 kg)   LMP 10/14/2011   SpO2 100%   BMI 26.23 kg/m   Visual Acuity Right Eye Distance:   Left Eye Distance:   Bilateral Distance:    Right Eye Near:   Left Eye Near:    Bilateral Near:     Physical Exam Vitals and nursing note reviewed.  Constitutional:  General: She is not in acute distress. HENT:      Head: Normocephalic.     Mouth/Throat:     Mouth: Mucous membranes are moist.  Eyes:     Pupils: Pupils are equal, round, and reactive to light.  Cardiovascular:     Rate and Rhythm: Normal rate and regular rhythm.     Heart sounds: Normal heart sounds.  Pulmonary:     Effort: Pulmonary effort is normal.     Breath sounds: Normal breath sounds.  Musculoskeletal:     Cervical back: Normal range of motion.     Comments: Tenderness along left posterior ribcage, primarily more inferior, and some tenderness along bilateral anterior ribcage.   Skin:    Findings: No rash.  Neurological:     Mental Status: She is alert.     Gait: Gait normal.  Psychiatric:        Mood and Affect: Mood normal.     UC Treatments / Results  Labs (all labs ordered are listed, but only abnormal results are displayed) Labs Reviewed - No data to display  EKG   Radiology DG Chest 2 View  Result Date: 08/11/2021 CLINICAL DATA:  Cough EXAM: CHEST - 2 VIEW COMPARISON:  None. FINDINGS: Heart size and mediastinal contours are within normal limits. No suspicious pulmonary opacities identified. No pleural effusion or pneumothorax visualized. No acute osseous abnormality appreciated. IMPRESSION: No acute intrathoracic process identified. Electronically Signed   By: Ofilia Neas M.D.   On: 08/11/2021 19:57    Procedures ED EKG  Date/Time: 08/11/2021 7:33 PM Performed by: Delsa Sale, PA Authorized by: Delsa Sale, PA   Previous ECG:    Previous ECG:  Unavailable Interpretation:    Interpretation: abnormal   Quality:    Tracing quality:  Limited by artifact Rate:    ECG rate:  64   ECG rate assessment: normal   Rhythm:    Rhythm: sinus rhythm   Ectopy:    Ectopy: none   QRS:    QRS axis:  Normal   QRS intervals:  Normal   QRS conduction: normal   ST segments:    ST segments:  Normal T waves:    T waves: inverted     Inverted:  V1 and V2 Q waves:    Abnormal Q-waves: not present    (including critical care time)  Medications Ordered in UC Medications - No data to display  Initial Impression / Assessment and Plan / UC Course  I have reviewed the triage vital signs and the nursing notes.  Pertinent labs & imaging results that were available during my care of the patient were reviewed by me and considered in my medical decision making (see chart for details).     Normal CXR. Grossly normal EKG, possible past infarct given T wave inversions but no signs of acute process. S/s consistent with viral bronchitis and chest wall discomfort. Steroids and cough medication Rx. Follow-up and ER precautions discussed.   E/M: 1 acute uncomplicated illness, 1 data (EKG), moderate risk due to prescription management  Final Clinical Impressions(s) / UC Diagnoses   Final diagnoses:  Acute bronchitis, unspecified organism  Chest wall discomfort     Discharge Instructions      No pneumonia seen on x-ray. Symptoms consistent with bronchitis causing irritation of chest muscles causing the discomfort. Take prednisone as prescribed to help with discomfort, tightness, and shortness of breath. Take cough medicine as prescribed as needed. Follow-up with PCP if no  improvement in a week or worsening.  Follow-up with PCP for elevated blood pressure.  Go to the ER if develop worsening difficulty breathing or chest pain.      ED Prescriptions     Medication Sig Dispense Auth. Provider   predniSONE (STERAPRED UNI-PAK 21 TAB) 10 MG (21) TBPK tablet Take as directed 1 each Abner Greenspan, Pascal Stiggers L, PA   benzonatate (TESSALON) 100 MG capsule Take 1 capsule (100 mg total) by mouth every 8 (eight) hours. 21 capsule Abner Greenspan, Charnette Younkin L, PA      PDMP not reviewed this encounter.   Delsa Sale, [place] 08/11/21 2014

## 2021-08-11 NOTE — Discharge Instructions (Addendum)
No pneumonia seen on x-ray. Symptoms consistent with bronchitis causing irritation of chest muscles causing the discomfort. Take prednisone as prescribed to help with discomfort, tightness, and shortness of breath. Take cough medicine as prescribed as needed. Follow-up with PCP if no improvement in a week or worsening.  Follow-up with PCP for elevated blood pressure.  Go to the ER if develop worsening difficulty breathing or chest pain.

## 2021-08-11 NOTE — ED Triage Notes (Signed)
Pt states that she has a cough, right arm pain and sob. X1 week  Pt states that she has some back pain under her left shoulder blade.

## 2021-08-16 ENCOUNTER — Ambulatory Visit: Payer: Self-pay | Admitting: *Deleted

## 2021-08-16 NOTE — Telephone Encounter (Signed)
  Chief Complaint: left side back pain  Symptoms: radiates from left side of back to left shoulder  Frequency: constant Pertinent Negatives: Patient denies severe pain , can walk and perform ADL's  Disposition: [] ED /[] Urgent Care (no appt availability in office) / [x] Appointment(In office/virtual)/ []  [facility]/ [] Home Care/ [] Refused Recommended Disposition  Additional Notes: constant back pain sleeps sitting up ,  taking ibuprofen 200 mg with "some relief". Appt scheduled for 08/18/21.

## 2021-08-16 NOTE — Telephone Encounter (Signed)
Reason for Disposition  Back pain present > 2 weeks  Answer Assessment - Initial Assessment Questions 1. ONSET: "When did the pain begin?"      Prior to 08/11/21 2. LOCATION: "Where does it hurt?" (upper, mid or lower back)     Left side of back radiating to shoulder 3. SEVERITY: "How bad is the pain?"  (e.g., Scale 1-10; mild, moderate, or severe)   - MILD (1-3): doesn't interfere with normal activities    - MODERATE (4-7): interferes with normal activities or awakens from sleep    - SEVERE (8-10): excruciating pain, unable to do any normal activities      mild 4. PATTERN: "Is the pain constant?" (e.g., yes, no; constant, intermittent)      Constant  5. RADIATION: "Does the pain shoot into your legs or elsewhere?"     Radiates to left shoulder 6. CAUSE:  "What do you think is causing the back pain?"      Not sure  7. BACK OVERUSE:  "Any recent lifting of heavy objects, strenuous work or exercise?"     no 8. MEDICATIONS: "What have you taken so far for the pain?" (e.g., nothing, acetaminophen, NSAIDS)     Ibuprofen 200 mg with some relief 9. NEUROLOGIC SYMPTOMS: "Do you have any weakness, numbness, or problems with bowel/bladder control?"     no 10. OTHER SYMPTOMS: "Do you have any other symptoms?" (e.g., fever, abdominal pain, burning with urination, blood in urine)       no 11. PREGNANCY: "Is there any chance you are pregnant?" (e.g., yes, no; LMP)       na  Protocols used: Back Pain-A-AH

## 2021-08-18 ENCOUNTER — Inpatient Hospital Stay: Payer: Self-pay | Admitting: Internal Medicine

## 2021-08-20 ENCOUNTER — Ambulatory Visit (INDEPENDENT_AMBULATORY_CARE_PROVIDER_SITE_OTHER): Payer: 59 | Admitting: Internal Medicine

## 2021-08-20 ENCOUNTER — Other Ambulatory Visit: Payer: Self-pay

## 2021-08-20 ENCOUNTER — Encounter: Payer: Self-pay | Admitting: Internal Medicine

## 2021-08-20 VITALS — BP 130/70 | HR 83 | Temp 97.9°F | Resp 18 | Ht 66.5 in | Wt 166.8 lb

## 2021-08-20 DIAGNOSIS — M549 Dorsalgia, unspecified: Secondary | ICD-10-CM

## 2021-08-20 MED ORDER — CYCLOBENZAPRINE HCL 10 MG PO TABS: 10.0000 mg | ORAL_TABLET | Freq: Three times a day (TID) | ORAL | 0 refills | Status: DC | PRN

## 2021-08-20 NOTE — Progress Notes (Signed)
Subjective:    Patient ID: Michele Gilmore, female    DOB: 10-27-1970, 50 y.o.   MRN: [id number]  HPI  Pt presents to the clinic today with c/o upper back pain. This started 2 weeks ago. She describes the pain as sore and tight. The pain radiates into her shoulders. She initially started with runny nose, chest tightness, cough and body aches. She was seen at [facility] 12/7 for the same, diagnosed with bronchitis, and left chest wall pain. She was treated with Prednisone and cough syrup. Since that time, she reports the pain in her upper back has persisted. She currently denies headache, runny nose, nasal congestion, ear pain, sore throat, cough, shortness of breath, nausea, vomiting, diarrhea, fever, or chills. She has tried [employer] and BioFreeze OTC with minimal relief of symptoms. She has not noticed a rash. She denies any injury to the area that she is aware of.  Review of Systems     Past Medical History:  Diagnosis Date   Anemia    Arthritis    Cataract    Heart murmur    as child, no problem   Hypertension    IBS (irritable bowel syndrome)     Current Outpatient Medications  Medication Sig Dispense Refill   benzonatate (TESSALON) 100 MG capsule Take 1 capsule (100 mg total) by mouth every 8 (eight) hours. 21 capsule 0   Cholecalciferol (EQL VITAMIN D3) 50 MCG (2000 UT) CAPS Take by mouth.     cyanocobalamin 1000 MCG tablet Take 2,000 mcg by mouth daily.     dicyclomine (BENTYL) 10 MG capsule Take 1 capsule (10 mg total) by mouth 3 (three) times daily before meals. (Patient taking differently: Take 10 mg by mouth 3 (three) times daily before meals. PRN) 90 capsule 11   linaclotide (LINZESS) 145 MCG CAPS capsule Take 1 capsule (145 mcg total) by mouth daily before breakfast. 90 capsule 1   lisinopril-hydrochlorothiazide (ZESTORETIC) 20-25 MG tablet TAKE 1 TABLET BY MOUTH DAILY 90 tablet 2   meloxicam (MOBIC) 15 MG tablet Take 1 tablet (15 mg total) by mouth daily. (Patient taking  differently: Take 15 mg by mouth daily. Uses PRN) 90 tablet 0   Omega-3 Fatty Acids (FISH OIL) 1200 MG CAPS Take one by mouth daily     polyethylene glycol powder (GLYCOLAX/MIRALAX) powder Use daily as directed (Patient not taking: Reported on 10/02/2020) 255 g 3   predniSONE (STERAPRED UNI-PAK 21 TAB) 10 MG (21) TBPK tablet Take as directed 1 each 0   thiamine (VITAMIN B-1) 100 MG tablet Take 100 mg by mouth daily.     No current facility-administered medications for this visit.    No Known Allergies  Family History  Problem Relation Age of Onset   Hypertension Mother    Stroke Mother    Hypertension Father    Prostate cancer Maternal Grandfather    Pancreatic cancer Maternal Uncle    Colon cancer Neg Hx    Esophageal cancer Neg Hx    Rectal cancer Neg Hx    Stomach cancer Neg Hx     Social History   Socioeconomic History   Marital status: Single    Spouse name: Not on file   Number of children: 3   Years of education: Not on file   Highest education level: Not on file  Occupational History   Not on file  Tobacco Use   Smoking status: Never   Smokeless tobacco: Never  Vaping Use   Vaping Use:  Never used  Substance and Sexual Activity   Alcohol use: Yes    Alcohol/week: 0.0 standard drinks    Comment: socially   Drug use: No   Sexual activity: Not Currently    Birth control/protection: Surgical    Comment: hysterectomy   Other Topics Concern   Not on file  Social History Narrative   Not on file   Social Determinants of Health   Financial Resource Strain: Not on file  Food Insecurity: Not on file  Transportation Needs: Not on file  Physical Activity: Not on file  Stress: Not on file  Social Connections: Not on file  Intimate Partner Violence: Not on file     Constitutional: Denies fever, malaise, fatigue, headache or abrupt weight changes.  HEENT: Denies eye pain, eye redness, ear pain, ringing in the ears, wax buildup, runny nose, nasal congestion,  bloody nose, or sore throat. Respiratory: Denies difficulty breathing, shortness of breath, cough or sputum production.   Cardiovascular: Denies chest pain, chest tightness, palpitations or swelling in the hands or feet.  Gastrointestinal: Denies abdominal pain, bloating, constipation, diarrhea or blood in the stool.  Musculoskeletal: Pt reports upper back pain, left shoulder pain. Denies decrease in range of motion, difficulty with gait, or joint swelling.  Skin: Denies redness, rashes, lesions or ulcercations.   No other specific complaints in a complete review of systems (except as listed in HPI above).  Objective:   Physical Exam  BP 130/70 (BP Location: Right Arm, Patient Position: Sitting, Cuff Size: Normal)    Pulse 83    Temp 97.9 F (36.6 C) (Temporal)    Resp 18    Ht 5' 6.5" (1.689 m)    Wt 75.7 kg    LMP 10/14/2011    SpO2 100%    BMI 26.52 kg/m   Wt Readings from Last 3 Encounters:  08/11/21 165 lb (74.8 kg)  11/02/20 154 lb (69.9 kg)  10/02/20 154 lb (69.9 kg)    General: Appears her stated age, overweight, in NAD. Skin: Warm, dry and intact. No rashes noted. HEENT: Head: normal shape and size; Eyes: sclera white and EOMs intact;  Neck:  No adenopathy noted. Cardiovascular: Normal rate and rhythm. S1,S2 noted.  No murmur, rubs or gallops noted.  Pulmonary/Chest: Normal effort and positive vesicular breath sounds. No respiratory distress. No wheezes, rales or ronchi noted.  Musculoskeletal: Normal flexion, extension and rotation of the spine. Normal internal and external rotation of bilateral shoulders. No bony tenderness noted over the spine. Pain with palpation of the trapezius bilaterally. Strength 5/5 BUE/BLE. No difficulty with gait.  Neurological: Alert and oriented.    BMET    Component Value Date/Time   NA 140 07/17/2020 1547   K 4.0 07/17/2020 1547   CL 103 07/17/2020 1547   CO2 25 07/17/2020 1547   GLUCOSE 77 07/17/2020 1547   GLUCOSE 86 07/25/2006 1102    BUN 14 07/17/2020 1547   CREATININE 1.10 07/17/2020 1547   CALCIUM 10.1 07/17/2020 1547   GFRNONAA >90 10/22/2011 0512   GFRAA >90 10/22/2011 0512    Lipid Panel     Component Value Date/Time   CHOL 156 07/17/2020 1547   TRIG 43 07/17/2020 1547   TRIG 48 07/25/2006 1102   HDL 79 07/17/2020 1547   CHOLHDL 2.0 07/17/2020 1547   VLDL 8.0 03/28/2019 1559   LDLCALC 65 07/17/2020 1547    CBC    Component Value Date/Time   WBC 4.5 07/17/2020 1547   RBC 4.72  07/17/2020 1547   HGB 12.7 07/17/2020 1547   HCT 40.5 07/17/2020 1547   PLT 240 07/17/2020 1547   MCV 85.8 07/17/2020 1547   MCH 26.9 (L) 07/17/2020 1547   MCHC 31.4 (L) 07/17/2020 1547   RDW 12.7 07/17/2020 1547   LYMPHSABS 1.5 07/11/2016 1544   MONOABS 0.3 07/11/2016 1544   EOSABS 0.1 07/11/2016 1544   BASOSABS 0.0 07/11/2016 1544    Hgb A1C No results found for: HGBA1C          Assessment & Plan:   Upper Back Pain:  [facility] notes reviewed, ECG and chest xray normal No evidence of bronchitis on exam ? Muscular in origin Advised her to alternate Tylenol and Ibuprofen OTC Encouraged her to try heat, stretching and massage RX for Flexeril 10 mg TID, sedation caution given  Return precautions discussed  Webb Silversmith, NP This visit occurred during the SARS-CoV-2 public health emergency.  Safety protocols were in place, including screening questions prior to the visit, additional usage of staff PPE, and extensive cleaning of exam room while observing appropriate contact time as indicated for disinfecting solutions.

## 2021-08-21 NOTE — Patient Instructions (Signed)

## 2021-08-23 ENCOUNTER — Ambulatory Visit: Payer: Self-pay | Admitting: Internal Medicine

## 2021-09-27 ENCOUNTER — Encounter: Payer: Self-pay | Admitting: Internal Medicine

## 2021-09-27 ENCOUNTER — Ambulatory Visit (INDEPENDENT_AMBULATORY_CARE_PROVIDER_SITE_OTHER): Payer: 59 | Admitting: Internal Medicine

## 2021-09-27 ENCOUNTER — Other Ambulatory Visit: Payer: Self-pay

## 2021-09-27 VITALS — BP 123/86 | HR 64 | Temp 97.1°F | Resp 18 | Ht 66.5 in | Wt 169.0 lb

## 2021-09-27 DIAGNOSIS — M1711 Unilateral primary osteoarthritis, right knee: Secondary | ICD-10-CM | POA: Diagnosis not present

## 2021-09-27 DIAGNOSIS — Z6826 Body mass index (BMI) 26.0-26.9, adult: Secondary | ICD-10-CM | POA: Diagnosis not present

## 2021-09-27 DIAGNOSIS — K219 Gastro-esophageal reflux disease without esophagitis: Secondary | ICD-10-CM

## 2021-09-27 DIAGNOSIS — E663 Overweight: Secondary | ICD-10-CM

## 2021-09-27 DIAGNOSIS — Z6828 Body mass index (BMI) 28.0-28.9, adult: Secondary | ICD-10-CM | POA: Insufficient documentation

## 2021-09-27 DIAGNOSIS — Z23 Encounter for immunization: Secondary | ICD-10-CM | POA: Diagnosis not present

## 2021-09-27 DIAGNOSIS — K581 Irritable bowel syndrome with constipation: Secondary | ICD-10-CM | POA: Diagnosis not present

## 2021-09-27 DIAGNOSIS — Z0001 Encounter for general adult medical examination with abnormal findings: Secondary | ICD-10-CM

## 2021-09-27 DIAGNOSIS — I10 Essential (primary) hypertension: Secondary | ICD-10-CM

## 2021-09-27 NOTE — Assessment & Plan Note (Signed)
Avoid foods that trigger reflux Encourage weight loss as this can help reduce reflux symptoms Okay to continue antacids OTC for now

## 2021-09-27 NOTE — Assessment & Plan Note (Signed)
Encourage regular physical activity Encouraged weight loss as this can help reduce joint pain Okay to continue topical meds OTC as needed

## 2021-09-27 NOTE — Addendum Note (Signed)
Addended by: Wilson Singer on: 09/27/2021 03:34 PM   Modules accepted: Orders

## 2021-09-27 NOTE — Progress Notes (Signed)
Subjective:    Patient ID: Michele Gilmore, female    DOB: 28-Nov-1970, 51 y.o.   MRN: [id number]  HPI  Patient presents the clinic today for her annual exam.  She is also due to follow-up chronic conditions.  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  She use Bengay and Voltaren Gel as needed with some relief of symptoms.  She does not follow with orthopedics.  IBS: Mainly constipation.  She takes Linzess as needed with good relief of symptoms.  She does not follow with GI.  HTN: Her BP today is 123/86.  She is taking Lisinopril HCT as prescribed.  ECG from 10/2011 reviewed.  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  She takes antacids OTC as needed with good relief of symptoms.  There is no upper GI on file.  Flu: 07/2020 Tetanus: 09/2017 COVID: [place] x2 Pap smear: 12/2016, hysterectomy Mammogram: 04/2020 Colon screening: 10/2020 Vision screening: annually Dentist: biannually  Diet: She does eat lean meat. She consumes some fruits and veggies. She does eat some fried foods. She drinks mostly water. Exercise: None  Review of Systems     Past Medical History:  Diagnosis Date   Anemia    Arthritis    Cataract    Heart murmur    as child, no problem   Hypertension    IBS (irritable bowel syndrome)     Current Outpatient Medications  Medication Sig Dispense Refill   benzonatate (TESSALON) 100 MG capsule Take 1 capsule (100 mg total) by mouth every 8 (eight) hours. 21 capsule 0   Cholecalciferol (EQL VITAMIN D3) 50 MCG (2000 UT) CAPS Take by mouth.     cyanocobalamin 1000 MCG tablet Take 2,000 mcg by mouth daily.     cyclobenzaprine (FLEXERIL) 10 MG tablet Take 1 tablet (10 mg total) by mouth 3 (three) times daily as needed for muscle spasms. 30 tablet 0   lisinopril-hydrochlorothiazide (ZESTORETIC) 20-25 MG tablet TAKE 1 TABLET BY MOUTH DAILY 90 tablet 2   No current facility-administered medications for this visit.    No Known Allergies  Family History  Problem Relation Age of Onset    Hypertension Mother    Stroke Mother    Hypertension Father    Prostate cancer Maternal Grandfather    Pancreatic cancer Maternal Uncle    Colon cancer Neg Hx    Esophageal cancer Neg Hx    Rectal cancer Neg Hx    Stomach cancer Neg Hx     Social History   Socioeconomic History   Marital status: Single    Spouse name: Not on file   Number of children: 3   Years of education: Not on file   Highest education level: Not on file  Occupational History   Not on file  Tobacco Use   Smoking status: Never   Smokeless tobacco: Never  Vaping Use   Vaping Use: Never used  Substance and Sexual Activity   Alcohol use: Yes    Alcohol/week: 0.0 standard drinks    Comment: socially   Drug use: No   Sexual activity: Not Currently    Birth control/protection: Surgical    Comment: hysterectomy   Other Topics Concern   Not on file  Social History Narrative   Not on file   Social Determinants of Health   Financial Resource Strain: Not on file  Food Insecurity: Not on file  Transportation Needs: Not on file  Physical Activity: Not on file  Stress: Not on file  Social Connections: Not on file  Intimate Partner Violence: Not on file     Constitutional: Denies fever, malaise, fatigue, headache or abrupt weight changes.  HEENT: Denies eye pain, eye redness, ear pain, ringing in the ears, wax buildup, runny nose, nasal congestion, bloody nose, or sore throat. Respiratory: Denies difficulty breathing, shortness of breath, cough or sputum production.   Cardiovascular: Denies chest pain, chest tightness, palpitations or swelling in the hands or feet.  Gastrointestinal: Patient reports intermittent reflux, constipation.  Denies abdominal pain, bloating, diarrhea or blood in the stool.  GU: Denies urgency, frequency, pain with urination, burning sensation, blood in urine, odor or discharge. Musculoskeletal: Patient reports intermittent joint pain.  Denies decrease in range of motion,  difficulty with gait, muscle pain or joint swelling.  Skin: Denies redness, rashes, lesions or ulcercations.  Neurological: Denies dizziness, difficulty with memory, difficulty with speech or problems with balance and coordination.  Psych: Denies anxiety, depression, SI/HI.  No other specific complaints in a complete review of systems (except as listed in HPI above).  Objective:   Physical Exam   BP 123/86 (BP Location: Right Arm, Patient Position: Sitting, Cuff Size: Normal)    Pulse 64    Temp (!) 97.1 F (36.2 C) (Temporal)    Resp 18    Ht 5' 6.5" (1.689 m)    Wt 169 lb (76.7 kg)    LMP 10/14/2011    SpO2 100%    BMI 26.87 kg/m   Wt Readings from Last 3 Encounters:  08/20/21 166 lb 12.8 oz (75.7 kg)  08/11/21 165 lb (74.8 kg)  11/02/20 154 lb (69.9 kg)    General: Appears her stated age, overweight in NAD. Skin: Warm, dry and intact.  HEENT: Head: normal shape and size; Eyes: sclera white and EOMs intact; Neck:  Neck supple, trachea midline. No masses, lumps or thyromegaly present.  Cardiovascular: Normal rate and rhythm. S1,S2 noted.  No murmur, rubs or gallops noted. No JVD or BLE edema.  Pulmonary/Chest: Normal effort and positive vesicular breath sounds. No respiratory distress. No wheezes, rales or ronchi noted.  Abdomen: Soft and nontender. Normal bowel sounds. Musculoskeletal: Strength 5/5 BUE/BLE.  No difficulty with gait.  Neurological: Alert and oriented. Cranial nerves II-XII grossly intact. Coordination normal.  Psychiatric: Mood and affect normal. Behavior is normal. Judgment and thought content normal.    BMET    Component Value Date/Time   NA 140 07/17/2020 1547   K 4.0 07/17/2020 1547   CL 103 07/17/2020 1547   CO2 25 07/17/2020 1547   GLUCOSE 77 07/17/2020 1547   GLUCOSE 86 07/25/2006 1102   BUN 14 07/17/2020 1547   CREATININE 1.10 07/17/2020 1547   CALCIUM 10.1 07/17/2020 1547   GFRNONAA >90 10/22/2011 0512   GFRAA >90 10/22/2011 0512    Lipid  Panel     Component Value Date/Time   CHOL 156 07/17/2020 1547   TRIG 43 07/17/2020 1547   TRIG 48 07/25/2006 1102   HDL 79 07/17/2020 1547   CHOLHDL 2.0 07/17/2020 1547   VLDL 8.0 03/28/2019 1559   LDLCALC 65 07/17/2020 1547    CBC    Component Value Date/Time   WBC 4.5 07/17/2020 1547   RBC 4.72 07/17/2020 1547   HGB 12.7 07/17/2020 1547   HCT 40.5 07/17/2020 1547   PLT 240 07/17/2020 1547   MCV 85.8 07/17/2020 1547   MCH 26.9 (L) 07/17/2020 1547   MCHC 31.4 (L) 07/17/2020 1547   RDW 12.7 07/17/2020 1547   LYMPHSABS 1.5 07/11/2016 1544  MONOABS 0.3 07/11/2016 1544   EOSABS 0.1 07/11/2016 1544   BASOSABS 0.0 07/11/2016 1544    Hgb A1C No results found for: HGBA1C         Assessment & Plan:   Preventative Health Maintenance:  Flu shot today Tetanus UTD Encouraged her to get her COVID booster She no longer needs Pap smears Mammogram has been ordered, she just needs to schedule this Colon screening UTD Encouraged her to consume a balanced diet and exercise regimen Advised her to see an eye doctor and dentist annually We will check CBC, c-Met, lipid and A1c today  RTC in 6 months, follow-up chronic conditions Webb Silversmith, NP This visit occurred during the SARS-CoV-2 public health emergency.  Safety protocols were in place, including screening questions prior to the visit, additional usage of staff PPE, and extensive cleaning of exam room while observing appropriate contact time as indicated for disinfecting solutions.

## 2021-09-27 NOTE — Assessment & Plan Note (Signed)
Encouraged high-fiber diet and adequate water intake Continue Linzess as needed

## 2021-09-27 NOTE — Assessment & Plan Note (Signed)
Controlled on Lisinopril HCT, refilled today °Reinforced DASH diet and exercise for weight loss °C-Met today °

## 2021-09-27 NOTE — Patient Instructions (Signed)
Health Maintenance for Postmenopausal Women ?Menopause is a normal process in which your ability to get pregnant comes to an end. This process happens slowly over many months or years, usually between the ages of 48 and 55. Menopause is complete when you have missed your menstrual period for 12 months. ?It is important to talk with your health care provider about some of the most common conditions that affect women after menopause (postmenopausal women). These include heart disease, cancer, and bone loss (osteoporosis). Adopting a healthy lifestyle and getting preventive care can help to promote your health and wellness. The actions you take can also lower your chances of developing some of these common conditions. ?What are the signs and symptoms of menopause? ?During menopause, you may have the following symptoms: ?Hot flashes. These can be moderate or severe. ?Night sweats. ?Decrease in sex drive. ?Mood swings. ?Headaches. ?Tiredness (fatigue). ?Irritability. ?Memory problems. ?Problems falling asleep or staying asleep. ?Talk with your health care provider about treatment options for your symptoms. ?Do I need hormone replacement therapy? ?Hormone replacement therapy is effective in treating symptoms that are caused by menopause, such as hot flashes and night sweats. ?Hormone replacement carries certain risks, especially as you become older. If you are thinking about using estrogen or estrogen with progestin, discuss the benefits and risks with your health care provider. ?How can I reduce my risk for heart disease and stroke? ?The risk of heart disease, heart attack, and stroke increases as you age. One of the causes may be a change in the body's hormones during menopause. This can affect how your body uses dietary fats, triglycerides, and cholesterol. Heart attack and stroke are medical emergencies. There are many things that you can do to help prevent heart disease and stroke. ?Watch your blood pressure ?High  blood pressure causes heart disease and increases the risk of stroke. This is more likely to develop in people who have high blood pressure readings or are overweight. ?Have your blood pressure checked: ?Every 3-5 years if you are 18-39 years of age. ?Every year if you are 40 years old or older. ?Eat a healthy diet ? ?Eat a diet that includes plenty of vegetables, fruits, low-fat dairy products, and lean protein. ?Do not eat a lot of foods that are high in solid fats, added sugars, or sodium. ?Get regular exercise ?Get regular exercise. This is one of the most important things you can do for your health. Most adults should: ?Try to exercise for at least 150 minutes each week. The exercise should increase your heart rate and make you sweat (moderate-intensity exercise). ?Try to do strengthening exercises at least twice each week. Do these in addition to the moderate-intensity exercise. ?Spend less time sitting. Even light physical activity can be beneficial. ?Other tips ?Work with your health care provider to achieve or maintain a healthy weight. ?Do not use any products that contain nicotine or tobacco. These products include cigarettes, chewing tobacco, and vaping devices, such as e-cigarettes. If you need help quitting, ask your health care provider. ?Know your numbers. Ask your health care provider to check your cholesterol and your blood sugar (glucose). Continue to have your blood tested as directed by your health care provider. ?Do I need screening for cancer? ?Depending on your health history and family history, you may need to have cancer screenings at different stages of your life. This may include screening for: ?Breast cancer. ?Cervical cancer. ?Lung cancer. ?Colorectal cancer. ?What is my risk for osteoporosis? ?After menopause, you may be   at increased risk for osteoporosis. Osteoporosis is a condition in which bone destruction happens more quickly than new bone creation. To help prevent osteoporosis or  the bone fractures that can happen because of osteoporosis, you may take the following actions: ?If you are 19-50 years old, get at least 1,000 mg of calcium and at least 600 international units (IU) of vitamin D per day. ?If you are older than age 50 but younger than age 70, get at least 1,200 mg of calcium and at least 600 international units (IU) of vitamin D per day. ?If you are older than age 70, get at least 1,200 mg of calcium and at least 800 international units (IU) of vitamin D per day. ?Smoking and drinking excessive alcohol increase the risk of osteoporosis. Eat foods that are rich in calcium and vitamin D, and do weight-bearing exercises several times each week as directed by your health care provider. ?How does menopause affect my mental health? ?Depression may occur at any age, but it is more common as you become older. Common symptoms of depression include: ?Feeling depressed. ?Changes in sleep patterns. ?Changes in appetite or eating patterns. ?Feeling an overall lack of motivation or enjoyment of activities that you previously enjoyed. ?Frequent crying spells. ?Talk with your health care provider if you think that you are experiencing any of these symptoms. ?General instructions ?See your health care provider for regular wellness exams and vaccines. This may include: ?Scheduling regular health, dental, and eye exams. ?Getting and maintaining your vaccines. These include: ?Influenza vaccine. Get this vaccine each year before the flu season begins. ?Pneumonia vaccine. ?Shingles vaccine. ?Tetanus, diphtheria, and pertussis (Tdap) booster vaccine. ?Your health care provider may also recommend other immunizations. ?Tell your health care provider if you have ever been abused or do not feel safe at home. ?Summary ?Menopause is a normal process in which your ability to get pregnant comes to an end. ?This condition causes hot flashes, night sweats, decreased interest in sex, mood swings, headaches, or lack  of sleep. ?Treatment for this condition may include hormone replacement therapy. ?Take actions to keep yourself healthy, including exercising regularly, eating a healthy diet, watching your weight, and checking your blood pressure and blood sugar levels. ?Get screened for cancer and depression. Make sure that you are up to date with all your vaccines. ?This information is not intended to replace advice given to you by your health care provider. Make sure you discuss any questions you have with your health care provider. ?Document Revised: 01/11/2021 Document Reviewed: 01/11/2021 ?Elsevier Patient Education ? 2022 Elsevier Inc. ? ?

## 2021-09-27 NOTE — Assessment & Plan Note (Signed)
Encourage diet and exercise for weight loss 

## 2021-09-28 LAB — COMPLETE METABOLIC PANEL WITH GFR
AG Ratio: 1.3 (calc) (ref 1.0–2.5)
ALT: 15 U/L (ref 6–29)
AST: 25 U/L (ref 10–35)
Albumin: 3.9 g/dL (ref 3.6–5.1)
Alkaline phosphatase (APISO): 67 U/L (ref 31–125)
BUN: 14 mg/dL (ref 7–25)
CO2: 26 mmol/L (ref 20–32)
Calcium: 9.6 mg/dL (ref 8.6–10.2)
Chloride: 106 mmol/L (ref 98–110)
Creat: 0.96 mg/dL (ref 0.50–0.99)
Globulin: 2.9 g/dL (calc) (ref 1.9–3.7)
Glucose, Bld: 100 mg/dL (ref 65–139)
Potassium: 3.7 mmol/L (ref 3.5–5.3)
Sodium: 139 mmol/L (ref 135–146)
Total Bilirubin: 0.7 mg/dL (ref 0.2–1.2)
Total Protein: 6.8 g/dL (ref 6.1–8.1)
eGFR: 73 mL/min/{1.73_m2} (ref >=60)

## 2021-09-28 LAB — LIPID PANEL
Cholesterol: 163 mg/dL (ref <200)
HDL: 65 mg/dL (ref >=50)
LDL Cholesterol (Calc): 85 mg/dL (calc)
Non-HDL Cholesterol (Calc): 98 mg/dL (calc) (ref <130)
Total CHOL/HDL Ratio: 2.5 (calc) (ref <5.0)
Triglycerides: 57 mg/dL (ref <150)

## 2021-09-28 LAB — CBC
HCT: 36.5 % (ref 35.0–45.0)
Hemoglobin: 11.9 g/dL (ref 11.7–15.5)
MCH: 27.2 pg (ref 27.0–33.0)
MCHC: 32.6 g/dL (ref 32.0–36.0)
MCV: 83.5 fL (ref 80.0–100.0)
MPV: 10.7 fL (ref 7.5–12.5)
Platelets: 229 10*3/uL (ref 140–400)
RBC: 4.37 10*6/uL (ref 3.80–5.10)
RDW: 13.7 % (ref 11.0–15.0)
WBC: 3.7 10*3/uL — ABNORMAL LOW (ref 3.8–10.8)

## 2021-09-28 LAB — HEMOGLOBIN A1C
Hgb A1c MFr Bld: 5 % of total Hgb (ref <5.7)
Mean Plasma Glucose: 97 mg/dL
eAG (mmol/L): 5.4 mmol/L

## 2021-11-01 ENCOUNTER — Other Ambulatory Visit: Payer: Self-pay | Admitting: Internal Medicine

## 2021-11-02 NOTE — Telephone Encounter (Signed)
Requested Prescriptions  Pending Prescriptions Disp Refills   lisinopril-hydrochlorothiazide (ZESTORETIC) 20-25 MG tablet [Pharmacy Med Name: LISINOPRIL-HCTZ 20/25MG  TABLETS] 90 tablet 1    Sig: TAKE 1 TABLET BY MOUTH DAILY     There is no refill protocol information for this order

## 2021-11-04 ENCOUNTER — Other Ambulatory Visit: Payer: Self-pay | Admitting: Internal Medicine

## 2021-11-05 NOTE — Telephone Encounter (Signed)
Requested medication (s) are due for refill today:   No ? ?Requested medication (s) are on the active medication list:   No ? ?Future visit scheduled:   Yes ? ? ?Last ordered: Returned because this is not on her medication list.   It was discontinued on 07/17/2020.    ? ?Requested Prescriptions  ?Pending Prescriptions Disp Refills  ? meloxicam (MOBIC) 15 MG tablet [Pharmacy Med Name: MELOXICAM 15MG  TABLETS] 90 tablet 0  ?  Sig: TAKE 1 TABLET(15 MG) BY MOUTH DAILY  ?  ? There is no refill protocol information for this order  ?  ? ?

## 2021-11-08 ENCOUNTER — Telehealth: Payer: 59 | Admitting: Internal Medicine

## 2021-11-22 ENCOUNTER — Other Ambulatory Visit: Payer: Self-pay

## 2021-11-22 MED ORDER — LINZESS 145 MCG PO CAPS: 145.0000 ug | ORAL_CAPSULE | Freq: Every day | ORAL | 1 refills | Status: DC | PRN

## 2022-07-29 ENCOUNTER — Other Ambulatory Visit: Payer: Self-pay | Admitting: Internal Medicine

## 2022-08-01 NOTE — Telephone Encounter (Signed)
Patient needs OV, will refill medication until OV can be made. Patient needs OV for additional refills.  Requested Prescriptions  Pending Prescriptions Disp Refills   lisinopril-hydrochlorothiazide (ZESTORETIC) 20-25 MG tablet [Pharmacy Med Name: LISINOPRIL-HCTZ 20/25MG TABLETS] 90 tablet 0    Sig: TAKE 1 TABLET BY MOUTH DAILY     Cardiovascular:  ACEI + Diuretic Combos Failed - 07/29/2022  1:51 PM      Failed - Na in normal range and within 180 days    Sodium  Date Value Ref Range Status  09/27/2021 139 135 - 146 mmol/L Final         Failed - K in normal range and within 180 days    Potassium  Date Value Ref Range Status  09/27/2021 3.7 3.5 - 5.3 mmol/L Final         Failed - Cr in normal range and within 180 days    Creat  Date Value Ref Range Status  09/27/2021 0.96 0.50 - 0.99 mg/dL Final         Failed - eGFR is 30 or above and within 180 days    GFR calc Af Amer  Date Value Ref Range Status  10/22/2011 >90 >90 mL/min Final    Comment:           The eGFR has been calculated using the CKD EPI equation. This calculation has not been validated in all clinical situations. eGFR's persistently <90 mL/min signify possible Chronic Kidney Disease.   GFR calc non Af Amer  Date Value Ref Range Status  10/22/2011 >90 >90 mL/min Final   GFR  Date Value Ref Range Status  03/28/2019 72.97 >60.00 mL/min Final   eGFR  Date Value Ref Range Status  09/27/2021 73 > OR = 60 mL/min/1.110m Final    Comment:    The eGFR is based on the CKD-EPI 2021 equation. To calculate  the new eGFR from a previous Creatinine or Cystatin C result, go to https://www.kidney.org/professionals/ kdoqi/gfr%5Fcalculator          Failed - Valid encounter within last 6 months    Recent Outpatient Visits           10 months ago [facility] with [facility]   11 [place]             Passed - Patient is not pregnant      Passed - Last BP in normal range    BP Readings from Last 1 Encounters:  09/27/21 123/86

## 2022-08-08 ENCOUNTER — Ambulatory Visit: Payer: Self-pay

## 2022-08-08 NOTE — Telephone Encounter (Signed)
  Chief Complaint: leg swelling Symptoms: R knee and leg swelling, comes and goes, pain 7/10, just switched positions at work where pt standing constantly  Frequency: on and off for years but today worse d/t standing  Pertinent Negatives: NA Disposition: '[]'$ ED /'[]'$ Urgent Care (no appt availability in office) / '[x]'$ Appointment(In office/virtual)/ '[]'$  [place] [facility]/ '[]'$ Home Care/ '[]'$ Refused Recommended Disposition /'[]'$ [facility]/ '[]'$  Follow-up with PCP Additional Notes: pt states she got switched to different position this morning at work and had this issue before and was taking Meloxicam but she stopped that. Now swelling is back and getting worse d/t constant standing. Pt asked for appt today but advised none available. Scheduled for tomorrow at 1600 with Dr. Raliegh Ip.  Reason for Disposition  [1] MODERATE leg swelling (e.g., swelling extends up to knees) AND [2] new-onset or worsening  Answer Assessment - Initial Assessment Questions 1. ONSET: "When did the swelling start?" (e.g., minutes, hours, days)     On and off but started new position  2. LOCATION: "What part of the leg is swollen?"  "Are both legs swollen or just one leg?"     R knee and RLE  3. SEVERITY: "How bad is the swelling?" (e.g., localized; mild, moderate, severe)   - Localized: Small area of swelling localized to one leg.   - MILD pedal edema: Swelling limited to foot and ankle, pitting edema < 1/4 inch (6 mm) deep, rest and elevation eliminate most or all swelling.   - MODERATE edema: Swelling of lower leg to knee, pitting edema > 1/4 inch (6 mm) deep, rest and elevation only partially reduce swelling.   - SEVERE edema: Swelling extends above knee, facial or hand swelling present.      Mild to moderate  5. PAIN: "Is the swelling painful to touch?" If Yes, ask: "How painful is it?"   (Scale 1-10; mild, moderate or severe)     7 10. OTHER SYMPTOMS: "Do you have any other symptoms?" (e.g., chest pain, difficulty  breathing)       no  Protocols used: Leg Swelling and Edema-A-AH

## 2022-08-09 ENCOUNTER — Telehealth: Payer: Self-pay

## 2022-08-09 ENCOUNTER — Ambulatory Visit: Payer: 59 | Admitting: Family Medicine

## 2022-08-09 NOTE — Telephone Encounter (Signed)
She was supposed to be seen in July for her follow-up, she needs to schedule this as soon as possible.  We can discuss work accommodations at that time.

## 2022-08-09 NOTE — Telephone Encounter (Signed)
Tried calling; Pt's voicemail is full.  PEC please schedule pt for a follow up visit.   Thanks,   -Mickel Baas

## 2022-08-09 NOTE — Telephone Encounter (Signed)
Copied from [place] [phone] [phone]. Topic: General - Other >> Aug 08, 2022  9:45 AM Everette C wrote: Reason for CRM: The patient has called to request a note for their job  The patient is required to stand in one place at work, which is not good for their previously treated, knee concerns  Please contact the patient further when possible regarding their request

## 2022-09-15 IMAGING — DX DG CHEST 2V
2 series · 2 of 2 positions shown · non-contrast
Comparison: None.

CLINICAL DATA: Cough

EXAM:
CHEST - 2 VIEW

[chest pa]
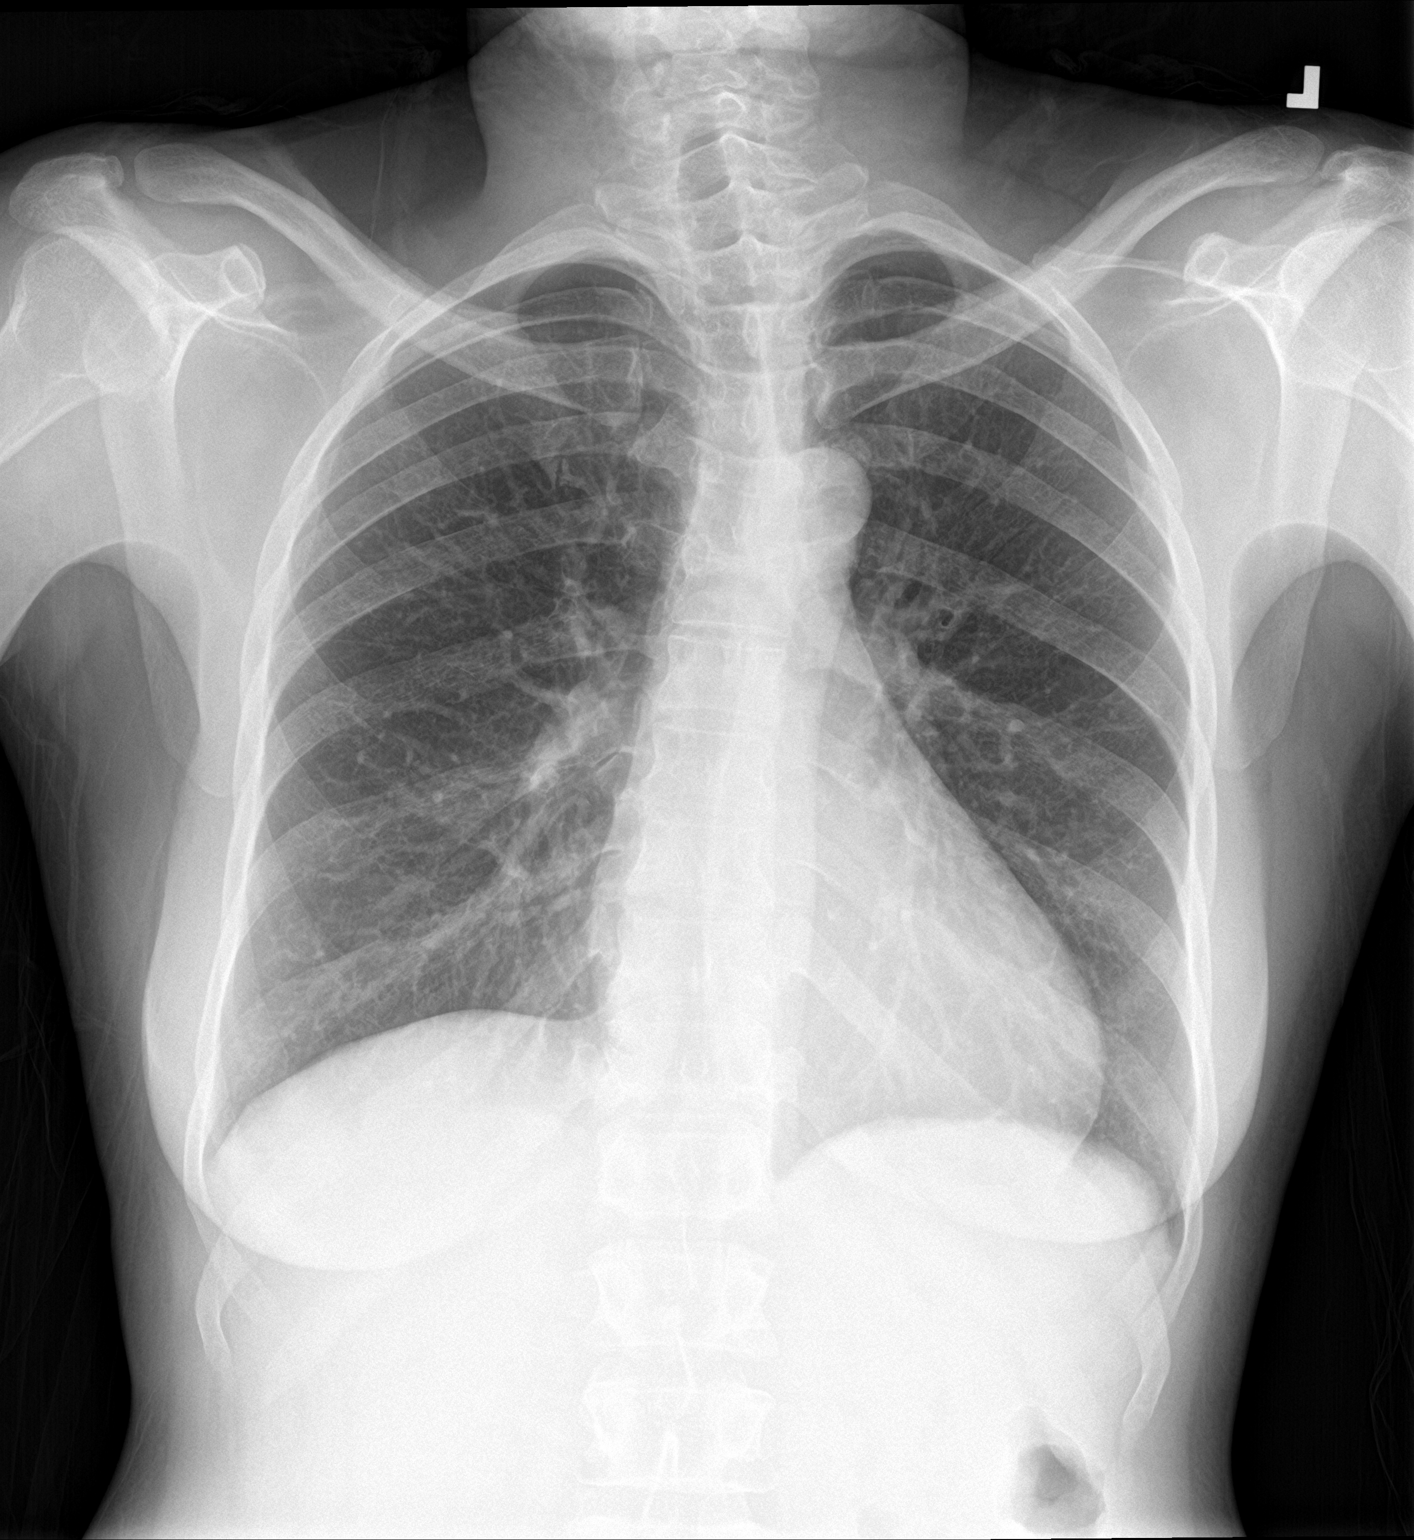

[chest lat]
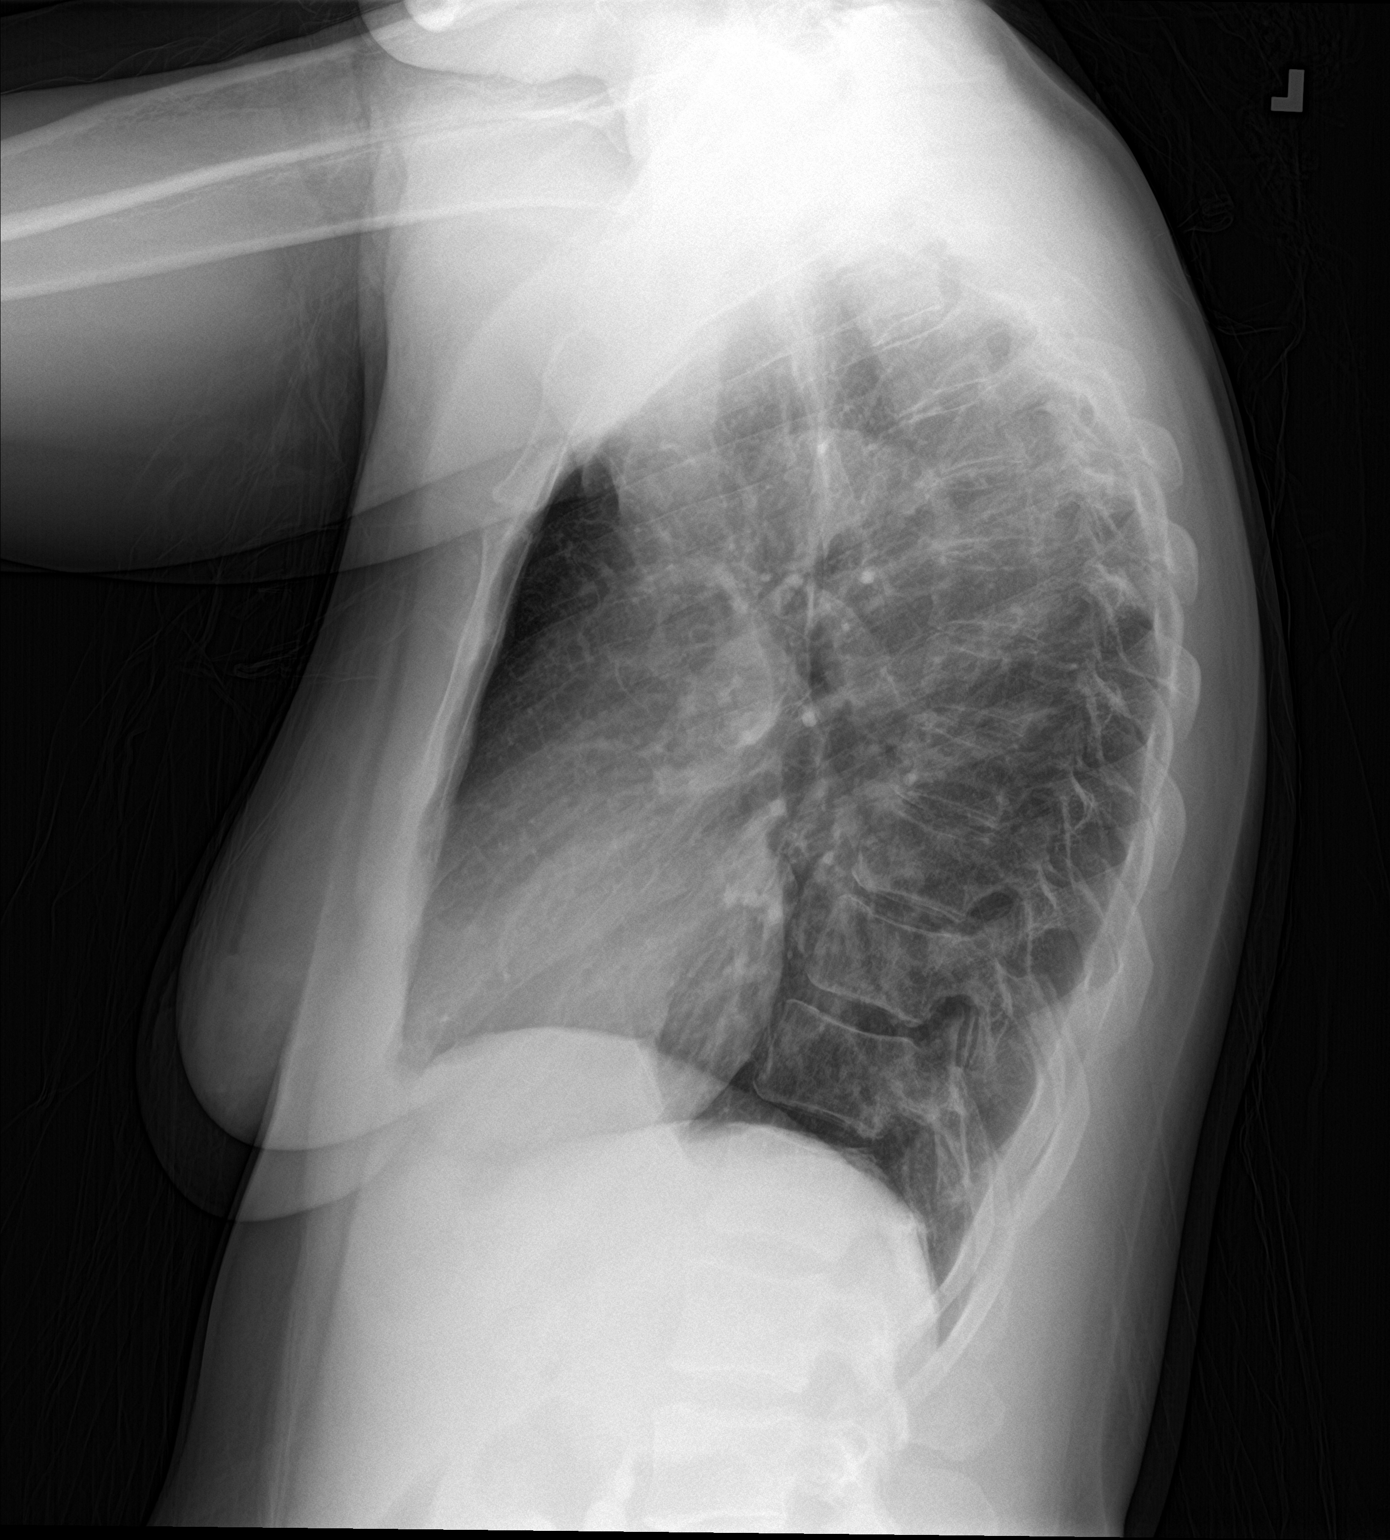

[2 of 2 positions shown; findings below may reference images not displayed]

FINDINGS: Heart size and mediastinal contours are within normal limits. No
suspicious pulmonary opacities identified.

No pleural effusion or pneumothorax visualized.

No acute osseous abnormality appreciated.
IMPRESSION: No acute intrathoracic process identified.

## 2022-09-19 ENCOUNTER — Encounter: Payer: Self-pay | Admitting: Internal Medicine

## 2022-09-19 ENCOUNTER — Ambulatory Visit (INDEPENDENT_AMBULATORY_CARE_PROVIDER_SITE_OTHER): Payer: 59 | Admitting: Internal Medicine

## 2022-09-19 ENCOUNTER — Ambulatory Visit: Payer: 59 | Admitting: Internal Medicine

## 2022-09-19 VITALS — BP 138/92 | HR 67 | Temp 96.9°F | Wt 182.0 lb

## 2022-09-19 DIAGNOSIS — K219 Gastro-esophageal reflux disease without esophagitis: Secondary | ICD-10-CM

## 2022-09-19 DIAGNOSIS — K581 Irritable bowel syndrome with constipation: Secondary | ICD-10-CM

## 2022-09-19 DIAGNOSIS — M25562 Pain in left knee: Secondary | ICD-10-CM

## 2022-09-19 DIAGNOSIS — Z136 Encounter for screening for cardiovascular disorders: Secondary | ICD-10-CM

## 2022-09-19 DIAGNOSIS — M1711 Unilateral primary osteoarthritis, right knee: Secondary | ICD-10-CM

## 2022-09-19 DIAGNOSIS — I10 Essential (primary) hypertension: Secondary | ICD-10-CM | POA: Diagnosis not present

## 2022-09-19 DIAGNOSIS — M25561 Pain in right knee: Secondary | ICD-10-CM

## 2022-09-19 DIAGNOSIS — E663 Overweight: Secondary | ICD-10-CM

## 2022-09-19 DIAGNOSIS — Z6828 Body mass index (BMI) 28.0-28.9, adult: Secondary | ICD-10-CM

## 2022-09-19 DIAGNOSIS — G8929 Other chronic pain: Secondary | ICD-10-CM

## 2022-09-19 MED ORDER — LISINOPRIL-HYDROCHLOROTHIAZIDE 20-25 MG PO TABS: 2.0000 | ORAL_TABLET | Freq: Every day | ORAL | 0 refills | Status: DC

## 2022-09-19 MED ORDER — OMEPRAZOLE 20 MG PO CPDR: 20.0000 mg | DELAYED_RELEASE_CAPSULE | Freq: Every day | ORAL | 1 refills | Status: DC

## 2022-09-19 MED ORDER — LINZESS 145 MCG PO CAPS: 145.0000 ug | ORAL_CAPSULE | Freq: Every day | ORAL | 1 refills | Status: DC | PRN

## 2022-09-19 NOTE — Assessment & Plan Note (Signed)
Encourage diet and exercise for weight loss 

## 2022-09-19 NOTE — Progress Notes (Signed)
Subjective:    Patient ID: Michele Gilmore, female    DOB: [birth year], 52 y.o.   MRN: [id number]  HPI  Patient presents to clinic today for follow-up of chronic condition.  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  She reports this has been worse lately.  She is using Bengay and Voltaren gel as needed with some relief of symptoms.  She does not follow with orthopedics.  IBS: Mainly constipation.  She takes Linzess as needed with good relief of symptoms.  She does not follow with GI.  Colonoscopy from 10/2020 reviewed.  HTN: Her BP today is 138/92.  She is taking Lisinopril HCT as prescribed.  ECG from 08/2021 reviewed.  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  She is not currently taking any medications for this.  There is no upper GI on file.  Review of Systems     Past Medical History:  Diagnosis Date   Anemia    Arthritis    Cataract    Heart murmur    as child, no problem   Hypertension    IBS (irritable bowel syndrome)     Current Outpatient Medications  Medication Sig Dispense Refill   Cholecalciferol (EQL VITAMIN D3) 50 MCG (2000 UT) CAPS Take by mouth.     cyanocobalamin 1000 MCG tablet Take 2,000 mcg by mouth daily.     cyclobenzaprine (FLEXERIL) 10 MG tablet Take 1 tablet (10 mg total) by mouth 3 (three) times daily as needed for muscle spasms. 30 tablet 0   LINZESS 145 MCG CAPS capsule Take 1 capsule (145 mcg total) by mouth daily as needed. Please schedule an appointment for further refills. Thank you 30 capsule 1   lisinopril-hydrochlorothiazide (ZESTORETIC) 20-25 MG tablet TAKE 1 TABLET BY MOUTH DAILY 90 tablet 0   No current facility-administered medications for this visit.    No Known Allergies  Family History  Problem Relation Age of Onset   Hypertension Mother    Stroke Mother    Hypertension Father    Prostate cancer Maternal Grandfather    Pancreatic cancer Maternal Uncle    Colon cancer Neg Hx    Esophageal cancer Neg Hx    Rectal cancer Neg Hx    Stomach cancer Neg Hx      Social History   Socioeconomic History   Marital status: Single    Spouse name: Not on file   Number of children: 3   Years of education: Not on file   Highest education level: Not on file  Occupational History   Not on file  Tobacco Use   Smoking status: Never   Smokeless tobacco: Never  Vaping Use   Vaping Use: Never used  Substance and Sexual Activity   Alcohol use: Yes    Alcohol/week: 0.0 standard drinks of alcohol    Comment: socially   Drug use: No   Sexual activity: Not Currently    Birth control/protection: Surgical    Comment: hysterectomy   Other Topics Concern   Not on file  Social History Narrative   Not on file   Social Determinants of Health   Financial Resource Strain: Not on file  Food Insecurity: Not on file  Transportation Needs: Not on file  Physical Activity: Not on file  Stress: Not on file  Social Connections: Not on file  Intimate Partner Violence: Not on file     Constitutional: Denies fever, malaise, fatigue, headache or abrupt weight changes.  HEENT: Denies eye pain, eye redness, ear pain, ringing in the  ears, wax buildup, runny nose, nasal congestion, bloody nose, or sore throat. Respiratory: Denies difficulty breathing, shortness of breath, cough or sputum production.   Cardiovascular: Denies chest pain, chest tightness, palpitations or swelling in the hands or feet.  Gastrointestinal: Patient reports intermittent reflux, constipation.  Denies abdominal pain, bloating, diarrhea or blood in the stool.  GU: Denies urgency, frequency, pain with urination, burning sensation, blood in urine, odor or discharge. Musculoskeletal: Patient reports knee pain.  Denies decrease in range of motion, difficulty with gait, muscle pain or joint swelling.  Skin: Denies redness, rashes, lesions or ulcercations.  Neurological: Denies dizziness, difficulty with memory, difficulty with speech or problems with balance and coordination.  Psych: Denies  anxiety, depression, SI/HI.  No other specific complaints in a complete review of systems (except as listed in HPI above).  Objective:   Physical Exam  BP (!) 138/92 (BP Location: Left Arm, Patient Position: Sitting, Cuff Size: Normal)   Pulse 67   Temp (!) 96.9 F (36.1 C) (Temporal)   Wt 182 lb (82.6 kg)   LMP 10/14/2011   SpO2 100%   BMI 28.94 kg/m   Wt Readings from Last 3 Encounters:  09/27/21 169 lb (76.7 kg)  08/20/21 166 lb 12.8 oz (75.7 kg)  08/11/21 165 lb (74.8 kg)    General: Appears her stated age, overweight in NAD. Skin: Warm, dry and intact.  HEENT: Head: normal shape and size;  Right Eye: Sclera injected; Left Eye: sclera white, no icterus, conjunctiva pink, PERRLA and EOMs intact;  Cardiovascular: Normal rate and rhythm. S1,S2 noted.  No murmur, rubs or gallops noted. No JVD or BLE edema.  Pulmonary/Chest: Normal effort and positive vesicular breath sounds. No respiratory distress. No wheezes, rales or ronchi noted.  Abdomen: Normal bowel sounds.  Musculoskeletal: Joint enlargement with effusion noted bilateral knees.  Strength 5/5 BLE.  No difficulty with gait.  Neurological: Alert and oriented.     BMET    Component Value Date/Time   NA 139 09/27/2021 1514   K 3.7 09/27/2021 1514   CL 106 09/27/2021 1514   CO2 26 09/27/2021 1514   GLUCOSE 100 09/27/2021 1514   GLUCOSE 86 07/25/2006 1102   BUN 14 09/27/2021 1514   CREATININE 0.96 09/27/2021 1514   CALCIUM 9.6 09/27/2021 1514   GFRNONAA >90 10/22/2011 0512   GFRAA >90 10/22/2011 0512    Lipid Panel     Component Value Date/Time   CHOL 163 09/27/2021 1514   TRIG 57 09/27/2021 1514   TRIG 48 07/25/2006 1102   HDL 65 09/27/2021 1514   CHOLHDL 2.5 09/27/2021 1514   VLDL 8.0 03/28/2019 1559   LDLCALC 85 09/27/2021 1514    CBC    Component Value Date/Time   WBC 3.7 (L) 09/27/2021 1514   RBC 4.37 09/27/2021 1514   HGB 11.9 09/27/2021 1514   HCT 36.5 09/27/2021 1514   PLT 229 09/27/2021  1514   MCV 83.5 09/27/2021 1514   MCH 27.2 09/27/2021 1514   MCHC 32.6 09/27/2021 1514   RDW 13.7 09/27/2021 1514   LYMPHSABS 1.5 07/11/2016 1544   MONOABS 0.3 07/11/2016 1544   EOSABS 0.1 07/11/2016 1544   BASOSABS 0.0 07/11/2016 1544    Hgb A1C Lab Results  Component Value Date   HGBA1C 5.0 09/27/2021           Assessment & Plan:   Right Eye Redness:  Try Visine red eye relief OTC If no improvement, make an appointment with your eye doctor  RTC in 6 months for annual exam Webb Silversmith, NP

## 2022-09-19 NOTE — Assessment & Plan Note (Signed)
Avoid foods that trigger reflux Encourage weight loss as this can help reduce reflux symptoms Rx for omeprazole 20 mg daily

## 2022-09-19 NOTE — Assessment & Plan Note (Signed)
Controlled on lisinopril HCT Increase to 2 tabs daily Reinforced DASH diet and exercise for weight loss C-Met today

## 2022-09-19 NOTE — Assessment & Plan Note (Signed)
Encouraged high-fiber diet and adequate water intake Continue Linzess 

## 2022-09-19 NOTE — Patient Instructions (Signed)

## 2022-09-19 NOTE — Assessment & Plan Note (Signed)
Referral to orthopedics placed for further evaluation Encourage weight loss as this can help reduce joint pain Okay to continue Bengay and Voltaren gel

## 2022-09-20 LAB — CBC
HCT: 38.8 % (ref 35.0–45.0)
Hemoglobin: 12.6 g/dL (ref 11.7–15.5)
MCH: 26.7 pg — ABNORMAL LOW (ref 27.0–33.0)
MCHC: 32.5 g/dL (ref 32.0–36.0)
MCV: 82.2 fL (ref 80.0–100.0)
MPV: 10.9 fL (ref 7.5–12.5)
Platelets: 239 10*3/uL (ref 140–400)
RBC: 4.72 10*6/uL (ref 3.80–5.10)
RDW: 13.2 % (ref 11.0–15.0)
WBC: 3.8 10*3/uL (ref 3.8–10.8)

## 2022-09-20 LAB — COMPLETE METABOLIC PANEL WITH GFR
AG Ratio: 1.3 (calc) (ref 1.0–2.5)
ALT: 14 U/L (ref 6–29)
AST: 20 U/L (ref 10–35)
Albumin: 3.9 g/dL (ref 3.6–5.1)
Alkaline phosphatase (APISO): 77 U/L (ref 31–125)
BUN: 15 mg/dL (ref 7–25)
CO2: 29 mmol/L (ref 20–32)
Calcium: 9.5 mg/dL (ref 8.6–10.2)
Chloride: 107 mmol/L (ref 98–110)
Creat: 0.98 mg/dL (ref 0.50–0.99)
Globulin: 2.9 g/dL (calc) (ref 1.9–3.7)
Glucose, Bld: 83 mg/dL (ref 65–99)
Potassium: 4.3 mmol/L (ref 3.5–5.3)
Sodium: 142 mmol/L (ref 135–146)
Total Bilirubin: 0.4 mg/dL (ref 0.2–1.2)
Total Protein: 6.8 g/dL (ref 6.1–8.1)
eGFR: 71 mL/min/{1.73_m2} (ref >=60)

## 2022-09-20 LAB — LIPID PANEL
Cholesterol: 151 mg/dL (ref <200)
HDL: 61 mg/dL (ref >=50)
LDL Cholesterol (Calc): 74 mg/dL (calc)
Non-HDL Cholesterol (Calc): 90 mg/dL (calc) (ref <130)
Total CHOL/HDL Ratio: 2.5 (calc) (ref <5.0)
Triglycerides: 78 mg/dL (ref <150)

## 2022-10-06 ENCOUNTER — Other Ambulatory Visit: Payer: Self-pay | Admitting: Internal Medicine

## 2022-10-06 NOTE — Telephone Encounter (Signed)
Last RF 09/19/22 #180   Requested Prescriptions  Refused Prescriptions Disp Refills   lisinopril-hydrochlorothiazide (ZESTORETIC) 20-25 MG tablet [Pharmacy Med Name: LISINOPRIL-HCTZ 20/'25MG'$  TABLETS] 90 tablet     Sig: TAKE 1 TABLET BY MOUTH DAILY     Cardiovascular:  ACEI + Diuretic Combos Failed - 10/06/2022  8:08 AM      Failed - Last BP in normal range    BP Readings from Last 1 Encounters:  09/19/22 (!) 138/92         Passed - Na in normal range and within 180 days    Sodium  Date Value Ref Range Status  09/19/2022 142 135 - 146 mmol/L Final         Passed - K in normal range and within 180 days    Potassium  Date Value Ref Range Status  09/19/2022 4.3 3.5 - 5.3 mmol/L Final         Passed - Cr in normal range and within 180 days    Creat  Date Value Ref Range Status  09/19/2022 0.98 0.50 - 0.99 mg/dL Final         Passed - eGFR is 30 or above and within 180 days    GFR calc Af Amer  Date Value Ref Range Status  10/22/2011 >90 >90 mL/min Final    Comment:           The eGFR has been calculated using the CKD EPI equation. This calculation has not been validated in all clinical situations. eGFR's persistently <90 mL/min signify possible Chronic Kidney Disease.   GFR calc non Af Amer  Date Value Ref Range Status  10/22/2011 >90 >90 mL/min Final   GFR  Date Value Ref Range Status  03/28/2019 72.97 >60.00 mL/min Final   eGFR  Date Value Ref Range Status  09/19/2022 71 > OR = 60 mL/min/1.52m Final         Passed - Patient is not pregnant      Passed - Valid encounter within last 6 months    Recent Outpatient Visits           2 weeks [facility]   1 year ago [facility] for general adult medical examination with abnormal findings   [facility]   1 year ago [facility]       Future Appointments             [facility]

## 2022-10-10 ENCOUNTER — Encounter: Payer: 59 | Admitting: Internal Medicine

## 2022-10-20 ENCOUNTER — Other Ambulatory Visit: Payer: Self-pay | Admitting: Internal Medicine

## 2022-10-20 NOTE — Telephone Encounter (Signed)
Requested medication (s) are due for refill today:   Provider to review  Requested medication (s) are on the active medication list:   Yes  Future visit scheduled:   No   Seen a month ago   Last ordered: 08/20/2021  #30, 0 refills  Non delegated refill    Requested Prescriptions  Pending Prescriptions Disp Refills   cyclobenzaprine (FLEXERIL) 10 MG tablet [Pharmacy Med Name: CYCLOBENZAPRINE 10MG TABLETS] 30 tablet 0    Sig: TAKE 1 TABLET(10 MG) BY MOUTH THREE TIMES DAILY AS NEEDED FOR MUSCLE SPASMS     Not Delegated - Analgesics:  Muscle Relaxants Failed - 10/20/2022 11:55 AM      Failed - This refill cannot be delegated      Passed - Valid encounter within last 6 months    Recent Outpatient Visits           1 month ago Chronic pain of both [facility] [place], Coralie Keens, NP   1 year ago Encounter for general adult medical examination with abnormal findings   [facility] [place], Coralie Keens, NP   1 year ago Upper back pain   [facility] [place], Coralie Keens, [place]

## 2023-01-09 ENCOUNTER — Ambulatory Visit (INDEPENDENT_AMBULATORY_CARE_PROVIDER_SITE_OTHER): Payer: 59

## 2023-01-09 ENCOUNTER — Encounter: Payer: Self-pay | Admitting: Family Medicine

## 2023-01-09 ENCOUNTER — Ambulatory Visit (INDEPENDENT_AMBULATORY_CARE_PROVIDER_SITE_OTHER): Payer: 59 | Admitting: Family Medicine

## 2023-01-09 VITALS — BP 122/92 | HR 62 | Temp 97.8°F | Ht 65.25 in | Wt 159.3 lb

## 2023-01-09 DIAGNOSIS — N951 Menopausal and female climacteric states: Secondary | ICD-10-CM

## 2023-01-09 DIAGNOSIS — I10 Essential (primary) hypertension: Secondary | ICD-10-CM | POA: Diagnosis not present

## 2023-01-09 DIAGNOSIS — M545 Low back pain, unspecified: Secondary | ICD-10-CM

## 2023-01-09 DIAGNOSIS — G8929 Other chronic pain: Secondary | ICD-10-CM

## 2023-01-09 MED ORDER — IBUPROFEN 800 MG PO TABS: 800.0000 mg | ORAL_TABLET | Freq: Three times a day (TID) | ORAL | 2 refills | Status: AC | PRN

## 2023-01-09 MED ORDER — TIZANIDINE HCL 4 MG PO TABS: 4.0000 mg | ORAL_TABLET | Freq: Three times a day (TID) | ORAL | 2 refills | Status: DC | PRN

## 2023-01-09 MED ORDER — ESTRADIOL 1 MG PO TABS: 1.0000 mg | ORAL_TABLET | Freq: Every day | ORAL | 1 refills | Status: DC

## 2023-01-09 NOTE — Progress Notes (Signed)
New Patient Office Visit  Subjective    Patient ID: Michele Gilmore, female    DOB: 02-23-1971  Age: 52 y.o. MRN: [id number]  CC:  Chief Complaint  Patient presents with   Establish Care    HPI Michele Gilmore presents to establish care Patient states that she has had chronic lower back pain for the last 2-3 years. She reports that she has been having pain more recently on the left side down in the hip, has been going on for the last 4 months. No increased pain with weight bearing, thinks that it might be "something on the inside" not really made worse with movement . Describes it as a dull achy feeling. Pt reports a history of hemorrhoids, states that there is some pain with bowel movements, states she had a colonoscopy 2 years ago. Normal urination, pt had a partial hysterectomy in 2013, left ovary was left in place, everything else was removed. No other associated symptoms, states that she has been exercising and losing weight, has been taking apple cider vinegar pills for appetite suppression.states that it hurts if you press down on the left flank. States that she was taking cyclobenzaprine 10 mg plus NSAIDS and states that they initially worked when it first started but they are no longer working.   Pt reports sweating and heart racing, states that she is having hot flashes as well, thinks she might be going through menopause.   Pt has a history of internal and external hemorrhoids, states that she is taking supplements to keep her stool soft. States that they have improved significantly with the bleeding and inflammation since her colonoscopy.   I have reviewed all aspects of the patient's medical history including social, family, and surgical history.   Current Outpatient Medications  Medication Instructions   Cholecalciferol (EQL VITAMIN D3) 50 MCG (2000 UT) CAPS Oral   cyanocobalamin 2,000 mcg, Oral, Daily   estradiol (ESTRACE) 1 mg, Oral, Daily   ibuprofen (ADVIL) 800 mg,  Oral, Every 8 hours PRN   lisinopril-hydrochlorothiazide (ZESTORETIC) 20-25 MG tablet 2 tablets, Oral, Daily   omeprazole (PRILOSEC) 20 mg, Oral, Daily   tiZANidine (ZANAFLEX) 4 mg, Oral, Every 8 hours PRN    Past Medical History:  Diagnosis Date   Anemia    Arthritis    Cataract    Heart murmur    as child, no problem   Hypertension    IBS (irritable bowel syndrome)     Past Surgical History:  Procedure Laterality Date   ABDOMINAL HYSTERECTOMY  10/21/2011   Procedure: HYSTERECTOMY ABDOMINAL;  Surgeon: Brock Bad, MD;  Location: [facility];  Service: Gynecology;  Laterality: N/A;  Right oophorectomy   EYE SURGERY  2013   [facility]    SVD     x 3   TUBAL LIGATION  06/2001   WISDOM TOOTH EXTRACTION      Family History  Problem Relation Age of Onset   Hyperlipidemia Mother    Hypertension Mother    Stroke Mother    Hypertension Father    Hyperlipidemia Sister    Pancreatic cancer Maternal Uncle    Prostate cancer Maternal Grandfather    Colon cancer Neg Hx    Esophageal cancer Neg Hx    Rectal cancer Neg Hx    Stomach cancer Neg Hx     Social History   Socioeconomic History   Marital status: Single    Spouse name: Not on file   Number of children:  3   Years of education: Not on file   Highest education level: Not on file  Occupational History   Not on file  Tobacco Use   Smoking status: Never   Smokeless tobacco: Never  Vaping Use   Vaping Use: Never used  Substance and Sexual Activity   Alcohol use: Yes    Alcohol/week: 0.0 standard drinks of alcohol    Comment: socially   Drug use: Never   Sexual activity: Yes    Birth control/protection: Surgical    Comment: hysterectomy   Other Topics Concern   Not on file  Social History Narrative   Not on file   Social Determinants of Health   Financial Resource Strain: Not on file  Food Insecurity: Not on file  Transportation Needs: Not on file  Physical Activity: Not on file  Stress: Not on  file  Social Connections: Not on file  Intimate Partner Violence: Not on file    Review of Systems  All other systems reviewed and are negative.       Objective    BP (!) 122/92 (BP Location: Left Arm, Patient Position: Sitting, Cuff Size: Normal)   Pulse 62   Temp 97.8 F (36.6 C) (Oral)   Ht 5' 5.25" (1.657 m)   Wt 159 lb 4.8 oz (72.3 kg)   LMP 10/14/2011   SpO2 97%   BMI 26.31 kg/m   Physical Exam Vitals reviewed.  Constitutional:      Appearance: Normal appearance. She is well-groomed and normal weight.  Eyes:     Conjunctiva/sclera: Conjunctivae normal.  Neck:     Thyroid: No thyromegaly.  Cardiovascular:     Rate and Rhythm: Normal rate and regular rhythm.     Pulses: Normal pulses.     Heart sounds: S1 normal and S2 normal.  Pulmonary:     Effort: Pulmonary effort is normal.     Breath sounds: Normal breath sounds and air entry.  Abdominal:     General: Abdomen is flat. Bowel sounds are normal.     Palpations: Abdomen is soft.  Musculoskeletal:     Right lower leg: No edema.     Left lower leg: No edema.  Neurological:     Mental Status: She is alert and oriented to person, place, and time. Mental status is at baseline.     Gait: Gait is intact.  Psychiatric:        Mood and Affect: Mood and affect normal.        Speech: Speech normal.        Behavior: Behavior normal.        Judgment: Judgment normal.     Last metabolic panel Lab Results  Component Value Date   GLUCOSE 83 09/19/2022   NA 142 09/19/2022   K 4.3 09/19/2022   CL 107 09/19/2022   CO2 29 09/19/2022   BUN 15 09/19/2022   CREATININE 0.98 09/19/2022   EGFR 71 09/19/2022   CALCIUM 9.5 09/19/2022   PROT 6.8 09/19/2022   ALBUMIN 4.2 03/28/2019   BILITOT 0.4 09/19/2022   ALKPHOS 78 03/28/2019   AST 20 09/19/2022   ALT 14 09/19/2022   Last lipids Lab Results  Component Value Date   CHOL 151 09/19/2022   HDL 61 09/19/2022   LDLCALC 74 09/19/2022   TRIG 78 09/19/2022    CHOLHDL 2.5 09/19/2022        Assessment & Plan:  Essential hypertension Assessment & Plan: Current hypertension medications:  Sig   lisinopril-hydrochlorothiazide (ZESTORETIC) 20-25 MG tablet (Taking) Take 2 tablets by mouth daily.      Bp is relatively controlled with the above medication, will continue as prescribed  Orders: -     TSH  Chronic left-sided low back pain without sciatica Assessment & Plan: Unclear etiology, her physical exam is benign except for some tenderness at the left lower costal margin. Will try changing her muscle relaxer to tizanidine and will rx ibuprofen 800 mg as needed. Will check lumbar films today as well.   Orders: -     DG Lumbar Spine Complete; Future -     Ibuprofen; Take 1 tablet (800 mg total) by mouth every 8 (eight) hours as needed.  Dispense: 90 tablet; Refill: 2 -     tiZANidine HCl; Take 1 tablet (4 mg total) by mouth every 8 (eight) hours as needed for muscle spasms.  Dispense: 30 tablet; Refill: 2  Menopausal flushing Assessment & Plan: S/p hysterectomy and right oophorectomy. We had a long discussion about starting HRT for treatment and she is agreeable. Rx sent.   Orders: -     Estradiol; Take 1 tablet (1 mg total) by mouth daily.  Dispense: 90 tablet; Refill: 1    Return in about 3 months (around 04/11/2023) for follow up.   Karie Georges, MD

## 2023-01-10 LAB — TSH: TSH: 2.58 u[IU]/mL (ref 0.35–5.50)

## 2023-01-11 DIAGNOSIS — N951 Menopausal and female climacteric states: Secondary | ICD-10-CM | POA: Insufficient documentation

## 2023-01-11 DIAGNOSIS — G8929 Other chronic pain: Secondary | ICD-10-CM | POA: Insufficient documentation

## 2023-01-11 NOTE — Assessment & Plan Note (Signed)
Unclear etiology, her physical exam is benign except for some tenderness at the left lower costal margin. Will try changing her muscle relaxer to tizanidine and will rx ibuprofen 800 mg as needed. Will check lumbar films today as well.

## 2023-01-11 NOTE — Assessment & Plan Note (Signed)
S/p hysterectomy and right oophorectomy. We had a long discussion about starting HRT for treatment and she is agreeable. Rx sent.

## 2023-01-11 NOTE — Assessment & Plan Note (Signed)
Current hypertension medications:       Sig   lisinopril-hydrochlorothiazide (ZESTORETIC) 20-25 MG tablet (Taking) Take 2 tablets by mouth daily.      Bp is relatively controlled with the above medication, will continue as prescribed

## 2023-02-22 ENCOUNTER — Other Ambulatory Visit: Payer: Self-pay | Admitting: Internal Medicine

## 2023-02-22 NOTE — Telephone Encounter (Signed)
Requested Prescriptions  Pending Prescriptions Disp Refills   lisinopril-hydrochlorothiazide (ZESTORETIC) 20-25 MG tablet [Pharmacy Med Name: LISINOPRIL-HCTZ 20/25MG  TABLETS] 30 tablet 0    Sig: TAKE 1 TABLET BY MOUTH DAILY     Cardiovascular:  ACEI + Diuretic Combos Failed - 02/22/2023  8:07 AM      Failed - Last BP in normal range    BP Readings from Last 1 Encounters:  01/09/23 (!) 122/92         Passed - Na in normal range and within 180 days    Sodium  Date Value Ref Range Status  09/19/2022 142 135 - 146 mmol/L Final         Passed - K in normal range and within 180 days    Potassium  Date Value Ref Range Status  09/19/2022 4.3 3.5 - 5.3 mmol/L Final         Passed - Cr in normal range and within 180 days    Creat  Date Value Ref Range Status  09/19/2022 0.98 0.50 - 0.99 mg/dL Final         Passed - eGFR is 30 or above and within 180 days    GFR calc Af Amer  Date Value Ref Range Status  10/22/2011 >90 >90 mL/min Final    Comment:           The eGFR has been calculated using the CKD EPI equation. This calculation has not been validated in all clinical situations. eGFR's persistently <90 mL/min signify possible Chronic Kidney Disease.   GFR calc non Af Amer  Date Value Ref Range Status  10/22/2011 >90 >90 mL/min Final   GFR  Date Value Ref Range Status  03/28/2019 72.97 >60.00 mL/min Final   eGFR  Date Value Ref Range Status  09/19/2022 71 > OR = 60 mL/min/1.45m2 Final         Passed - Patient is not pregnant      Passed - Valid encounter within last 6 months    Recent Outpatient Visits           5 months ago Chronic pain of both knees   [place] [facility] [place], Salvadore Oxford, NP   1 year ago Encounter for general adult medical examination with abnormal findings   [facility] [place], Salvadore Oxford, NP   1 year ago Upper back pain   [place] [facility] [place], Salvadore Oxford, NP        Future Appointments             In 1 month Casimiro Needle, Vinetta Bergamo, MD [facility] [place], [facility]

## 2023-02-23 ENCOUNTER — Other Ambulatory Visit: Payer: Self-pay | Admitting: Family Medicine

## 2023-03-22 ENCOUNTER — Other Ambulatory Visit: Payer: Self-pay | Admitting: Internal Medicine

## 2023-03-23 NOTE — Telephone Encounter (Signed)
No longer under prescriber care Requested Prescriptions  Pending Prescriptions Disp Refills   lisinopril-hydrochlorothiazide (ZESTORETIC) 20-25 MG tablet [Pharmacy Med Name: LISINOPRIL-HCTZ 20/25MG  TABLETS] 30 tablet 0    Sig: TAKE 1 TABLET BY MOUTH DAILY     Cardiovascular:  ACEI + Diuretic Combos Failed - 03/22/2023 11:15 AM      Failed - Na in normal range and within 180 days    Sodium  Date Value Ref Range Status  09/19/2022 142 135 - 146 mmol/L Final         Failed - K in normal range and within 180 days    Potassium  Date Value Ref Range Status  09/19/2022 4.3 3.5 - 5.3 mmol/L Final         Failed - Cr in normal range and within 180 days    Creat  Date Value Ref Range Status  09/19/2022 0.98 0.50 - 0.99 mg/dL Final         Failed - eGFR is 30 or above and within 180 days    GFR calc Af Amer  Date Value Ref Range Status  10/22/2011 >90 >90 mL/min Final    Comment:           The eGFR has been calculated using the CKD EPI equation. This calculation has not been validated in all clinical situations. eGFR's persistently <90 mL/min signify possible Chronic Kidney Disease.   GFR calc non Af Amer  Date Value Ref Range Status  10/22/2011 >90 >90 mL/min Final   GFR  Date Value Ref Range Status  03/28/2019 72.97 >60.00 mL/min Final   eGFR  Date Value Ref Range Status  09/19/2022 71 > OR = 60 mL/min/1.7m2 Final         Failed - Last BP in normal range    BP Readings from Last 1 Encounters:  01/09/23 (!) 122/92         Failed - Valid encounter within last 6 months    Recent Outpatient Visits           6 months ago Chronic pain of both knees   [place] [facility] [place], Salvadore Oxford, NP   1 year ago Encounter for general adult medical examination with abnormal findings   [place] [facility] [place], Salvadore Oxford, NP   1 year ago Upper back pain   [place] [facility] [place], Salvadore Oxford, NP       Future  Appointments             In 2 weeks Karie Georges, MD [facility] [place], [facility]            Passed - Patient is not pregnant

## 2023-03-27 ENCOUNTER — Other Ambulatory Visit: Payer: Self-pay | Admitting: Family Medicine

## 2023-04-10 ENCOUNTER — Ambulatory Visit: Payer: 59 | Admitting: Family Medicine

## 2023-06-15 ENCOUNTER — Ambulatory Visit: Payer: 59 | Admitting: Obstetrics and Gynecology

## 2023-07-03 ENCOUNTER — Other Ambulatory Visit: Payer: Self-pay | Admitting: Family Medicine

## 2023-07-03 DIAGNOSIS — Z1231 Encounter for screening mammogram for malignant neoplasm of breast: Secondary | ICD-10-CM

## 2023-07-28 ENCOUNTER — Ambulatory Visit: Payer: 59

## 2023-10-13 ENCOUNTER — Ambulatory Visit (INDEPENDENT_AMBULATORY_CARE_PROVIDER_SITE_OTHER): Payer: 59 | Admitting: Family Medicine

## 2023-10-13 VITALS — BP 112/76 | HR 65 | Temp 97.6°F | Resp 16 | Ht 66.0 in | Wt 153.3 lb

## 2023-10-13 DIAGNOSIS — M545 Low back pain, unspecified: Secondary | ICD-10-CM

## 2023-10-13 DIAGNOSIS — N951 Menopausal and female climacteric states: Secondary | ICD-10-CM | POA: Diagnosis not present

## 2023-10-13 DIAGNOSIS — K219 Gastro-esophageal reflux disease without esophagitis: Secondary | ICD-10-CM

## 2023-10-13 DIAGNOSIS — G8929 Other chronic pain: Secondary | ICD-10-CM

## 2023-10-13 DIAGNOSIS — I10 Essential (primary) hypertension: Secondary | ICD-10-CM

## 2023-10-13 DIAGNOSIS — Z13220 Encounter for screening for lipoid disorders: Secondary | ICD-10-CM

## 2023-10-13 DIAGNOSIS — Z23 Encounter for immunization: Secondary | ICD-10-CM | POA: Diagnosis not present

## 2023-10-13 DIAGNOSIS — K581 Irritable bowel syndrome with constipation: Secondary | ICD-10-CM | POA: Diagnosis not present

## 2023-10-13 DIAGNOSIS — Z0000 Encounter for general adult medical examination without abnormal findings: Secondary | ICD-10-CM

## 2023-10-13 LAB — CBC WITH DIFFERENTIAL/PLATELET
Basophils Absolute: 0 10*3/uL (ref 0.0–0.1)
Basophils Relative: 0.7 % (ref 0.0–3.0)
Eosinophils Absolute: 0.1 10*3/uL (ref 0.0–0.7)
Eosinophils Relative: 1.9 % (ref 0.0–5.0)
HCT: 39.6 % (ref 36.0–46.0)
Hemoglobin: 12.9 g/dL (ref 12.0–15.0)
Lymphocytes Relative: 36 % (ref 12.0–46.0)
Lymphs Abs: 1 10*3/uL (ref 0.7–4.0)
MCHC: 32.6 g/dL (ref 30.0–36.0)
MCV: 83.3 fL (ref 78.0–100.0)
Monocytes Absolute: 0.1 10*3/uL (ref 0.1–1.0)
Monocytes Relative: 4.7 % (ref 3.0–12.0)
Neutro Abs: 1.7 10*3/uL (ref 1.4–7.7)
Neutrophils Relative %: 56.7 % (ref 43.0–77.0)
Platelets: 223 10*3/uL (ref 150.0–400.0)
RBC: 4.75 Mil/uL (ref 3.87–5.11)
RDW: 14.1 % (ref 11.5–15.5)
WBC: 2.9 10*3/uL — ABNORMAL LOW (ref 4.0–10.5)

## 2023-10-13 LAB — COMPREHENSIVE METABOLIC PANEL
ALT: 13 U/L (ref 0–35)
AST: 23 U/L (ref 0–37)
Albumin: 4.2 g/dL (ref 3.5–5.2)
Alkaline Phosphatase: 66 U/L (ref 39–117)
BUN: 12 mg/dL (ref 6–23)
CO2: 25 meq/L (ref 19–32)
Calcium: 9.7 mg/dL (ref 8.4–10.5)
Chloride: 103 meq/L (ref 96–112)
Creatinine, Ser: 1.02 mg/dL (ref 0.40–1.20)
GFR: 63.99 mL/min (ref >60.00)
Glucose, Bld: 84 mg/dL (ref 70–99)
Potassium: 3.7 meq/L (ref 3.5–5.1)
Sodium: 140 meq/L (ref 135–145)
Total Bilirubin: 0.6 mg/dL (ref 0.2–1.2)
Total Protein: 7.3 g/dL (ref 6.0–8.3)

## 2023-10-13 LAB — LIPID PANEL
Cholesterol: 170 mg/dL (ref 0–200)
HDL: 79.4 mg/dL (ref >39.00)
LDL Cholesterol: 82 mg/dL (ref 0–99)
NonHDL: 90.94
Total CHOL/HDL Ratio: 2
Triglycerides: 46 mg/dL (ref 0.0–149.0)
VLDL: 9.2 mg/dL (ref 0.0–40.0)

## 2023-10-13 MED ORDER — TIZANIDINE HCL 4 MG PO TABS: 4.0000 mg | ORAL_TABLET | Freq: Three times a day (TID) | ORAL | 2 refills | Status: AC | PRN

## 2023-10-13 MED ORDER — LISINOPRIL-HYDROCHLOROTHIAZIDE 20-25 MG PO TABS: 1.0000 | ORAL_TABLET | Freq: Every day | ORAL | 1 refills | Status: DC

## 2023-10-13 MED ORDER — OMEPRAZOLE 20 MG PO CPDR: 20.0000 mg | DELAYED_RELEASE_CAPSULE | Freq: Every day | ORAL | 1 refills | Status: AC

## 2023-10-13 MED ORDER — ESTRADIOL 1 MG PO TABS: 1.0000 mg | ORAL_TABLET | Freq: Every day | ORAL | 1 refills | Status: AC

## 2023-10-13 NOTE — Progress Notes (Signed)
 Complete physical exam  Patient: Michele Gilmore   DOB: 04-Nov-1970   53 y.o. Female  MRN: [id number]  Subjective:    Chief Complaint  Patient presents with   Annual Exam    Michele Gilmore is a 53 y.o. female who presents today for a complete physical exam. She reports consuming a general diet. Home exercise routine includes walking 2 hrs per week. She generally feels well. She reports sleeping well. She does not have additional problems to discuss today.    Most recent fall risk assessment:    10/13/2023   10:13 AM  Fall Risk   Falls in the past year? 0  Number falls in past yr: 0  Injury with Fall? 0  Risk for fall due to : No Fall Risks  Follow up Falls evaluation completed     Most recent depression screenings:    10/13/2023   10:13 AM 09/19/2022    3:47 PM  PHQ 2/9 Scores  PHQ - 2 Score 0 0    Vision:Within last year and Dr. Maximo -- [facility] and Dental: No current dental problems and Receives regular dental care  Patient Active Problem List   Diagnosis Date Noted   Chronic left-sided low back pain without sciatica 01/11/2023   Menopausal flushing 01/11/2023   Overweight with body mass index (BMI) of 28 to 28.9 in adult 09/27/2021   OA (osteoarthritis) of knee 03/28/2019   IBS (irritable bowel syndrome) 03/28/2019   GERD (gastroesophageal reflux disease) 03/28/2019   Essential hypertension 11/09/2007      Patient Care Team: Ozell Heron HERO, MD as PCP - General (Family Medicine) Rudy Carlin LABOR, MD as Consulting Physician (Obstetrics and Gynecology)   Outpatient Medications Prior to Visit  Medication Sig   Cholecalciferol (EQL VITAMIN D3) 50 MCG (2000 UT) CAPS Take by mouth.   cyanocobalamin 1000 MCG tablet Take 2,000 mcg by mouth daily.   ibuprofen  (ADVIL ) 800 MG tablet Take 1 tablet (800 mg total) by mouth every 8 (eight) hours as needed.   [DISCONTINUED] estradiol  (ESTRACE ) 1 MG tablet Take 1 tablet (1 mg total) by mouth daily.    [DISCONTINUED] lisinopril -hydrochlorothiazide  (ZESTORETIC ) 20-25 MG tablet TAKE 1 TABLET BY MOUTH DAILY   [DISCONTINUED] omeprazole  (PRILOSEC) 20 MG capsule Take 1 capsule (20 mg total) by mouth daily.   [DISCONTINUED] tiZANidine  (ZANAFLEX ) 4 MG tablet Take 1 tablet (4 mg total) by mouth every 8 (eight) hours as needed for muscle spasms.   No facility-administered medications prior to visit.    Review of Systems  HENT:  Negative for hearing loss.   Eyes:  Negative for blurred vision.  Respiratory:  Negative for shortness of breath.   Cardiovascular:  Negative for chest pain.  Gastrointestinal: Negative.   Genitourinary: Negative.   Musculoskeletal:  Negative for back pain.  Neurological:  Negative for headaches.  Psychiatric/Behavioral:  Negative for depression.        Objective:     BP 112/76 (BP Location: Left Arm, Patient Position: Sitting, Cuff Size: Normal)   Pulse 65   Temp 97.6 F (36.4 C) (Oral)   Resp 16   Ht 5' 6 (1.676 m)   Wt 153 lb 4.8 oz (69.5 kg)   LMP 10/14/2011   SpO2 99%   BMI 24.74 kg/m    Physical Exam Vitals reviewed.  Constitutional:      Appearance: Normal appearance. She is well-groomed and normal weight.  HENT:     Right Ear: Tympanic membrane and ear canal  normal.     Left Ear: Tympanic membrane and ear canal normal.     Mouth/Throat:     Mouth: Mucous membranes are moist.     Pharynx: No posterior oropharyngeal erythema.  Eyes:     Conjunctiva/sclera: Conjunctivae normal.  Neck:     Thyroid : No thyromegaly.  Cardiovascular:     Rate and Rhythm: Normal rate and regular rhythm.     Pulses: Normal pulses.     Heart sounds: S1 normal and S2 normal.  Pulmonary:     Effort: Pulmonary effort is normal.     Breath sounds: Normal breath sounds and air entry.  Abdominal:     General: Abdomen is flat. Bowel sounds are normal.     Palpations: Abdomen is soft.  Musculoskeletal:     Right lower leg: No edema.     Left lower leg: No edema.   Lymphadenopathy:     Cervical: No cervical adenopathy.  Neurological:     Mental Status: She is alert and oriented to person, place, and time. Mental status is at baseline.     Gait: Gait is intact.  Psychiatric:        Mood and Affect: Mood and affect normal.        Speech: Speech normal.        Behavior: Behavior normal.        Judgment: Judgment normal.     No results found for any visits on 10/13/23.     Assessment & Plan:    Routine Health Maintenance and Physical Exam  Immunization History  Administered Date(s) Administered   Influenza,inj,Quad PF,6+ Mos 06/19/2017, 07/17/2020, 09/27/2021   PFIZER(Purple Top)SARS-COV-2 Vaccination 11/24/2019, 12/15/2019   Tdap 09/07/2017    Health Maintenance  Topic Date Due   Pneumococcal Vaccine 49-19 Years old (1 of 2 - PCV) Never done   Zoster Vaccines- Shingrix (1 of 2) Never done   INFLUENZA VACCINE  04/06/2023   COVID-19 Vaccine (3 - Pfizer risk series) 01/09/2024 (Originally 01/12/2020)   MAMMOGRAM  01/09/2024   DTaP/Tdap/Td (2 - Td or Tdap) 09/08/2027   Colonoscopy  11/02/2030   Hepatitis C Screening  Completed   HIV Screening  Completed   HPV VACCINES  Aged Out    Discussed health benefits of physical activity, and encouraged her to engage in regular exercise appropriate for her age and condition.  Irritable bowel syndrome with constipation -     Ambulatory referral to Gastroenterology  Need for lipid screening -     Lipid panel; Future  Routine general medical examination at a health care facility -     CBC with Differential/Platelet; Future -     Comprehensive metabolic panel; Future  Menopausal flushing -     Estradiol ; Take 1 tablet (1 mg total) by mouth daily.  Dispense: 90 tablet; Refill: 1  Chronic left-sided low back pain without sciatica -     tiZANidine  HCl; Take 1 tablet (4 mg total) by mouth every 8 (eight) hours as needed for muscle spasms.  Dispense: 30 tablet; Refill: 2  Essential  hypertension -     Lisinopril -hydroCHLOROthiazide ; Take 1 tablet by mouth daily.  Dispense: 90 tablet; Refill: 1  Gastroesophageal reflux disease without esophagitis -     Omeprazole ; Take 1 capsule (20 mg total) by mouth daily.  Dispense: 90 capsule; Refill: 1  Needs flu shot -     Flu vaccine trivalent PF, 6mos and older(Flulaval,Afluria,Fluarix,Fluzone)  Physical exam findings are normal. I counseled the patient on  the shingles vaccinations, handouts given on healthy eating and exercise. Labs ordered for routine surveillance. Pt needed refills of her medication today, orders sent to pharmacy. Follow up in 6 months.   Return in about 6 months (around 04/11/2024) for HTN.     Heron CHRISTELLA Sharper, MD

## 2023-10-13 NOTE — Patient Instructions (Addendum)
 Ask your health insurance if they cover the Shingles vaccines.  Fiber, miralax  and increase hydration  Health Maintenance, Female Adopting a healthy lifestyle and getting preventive care are important in promoting health and wellness. Ask your health care provider about: The right schedule for you to have regular tests and exams. Things you can do on your own to prevent diseases and keep yourself healthy. What should I know about diet, weight, and exercise? Eat a healthy diet  Eat a diet that includes plenty of vegetables, fruits, low-fat dairy products, and lean protein. Do not eat a lot of foods that are high in solid fats, added sugars, or sodium. Maintain a healthy weight Body mass index (BMI) is used to identify weight problems. It estimates body fat based on height and weight. Your health care provider can help determine your BMI and help you achieve or maintain a healthy weight. Get regular exercise Get regular exercise. This is one of the most important things you can do for your health. Most adults should: Exercise for at least 150 minutes each week. The exercise should increase your heart rate and make you sweat (moderate-intensity exercise). Do strengthening exercises at least twice a week. This is in addition to the moderate-intensity exercise. Spend less time sitting. Even light physical activity can be beneficial. Watch cholesterol and blood lipids Have your blood tested for lipids and cholesterol at 52 years of age, then have this test every 5 years. Have your cholesterol levels checked more often if: Your lipid or cholesterol levels are high. You are older than 51 years of age. You are at high risk for heart disease. What should I know about cancer screening? Depending on your health history and family history, you may need to have cancer screening at various ages. This may include screening for: Breast cancer. Cervical cancer. Colorectal cancer. Skin cancer. Lung  cancer. What should I know about heart disease, diabetes, and high blood pressure? Blood pressure and heart disease High blood pressure causes heart disease and increases the risk of stroke. This is more likely to develop in people who have high blood pressure readings or are overweight. Have your blood pressure checked: Every 3-5 years if you are 65-37 years of age. Every year if you are 46 years old or older. Diabetes Have regular diabetes screenings. This checks your fasting blood sugar level. Have the screening done: Once every three years after age 48 if you are at a normal weight and have a low risk for diabetes. More often and at a younger age if you are overweight or have a high risk for diabetes. What should I know about preventing infection? Hepatitis B If you have a higher risk for hepatitis B, you should be screened for this virus. Talk with your health care provider to find out if you are at risk for hepatitis B infection. Hepatitis C Testing is recommended for: Everyone born from [birth year] through 1963. Anyone with known risk factors for hepatitis C. Sexually transmitted infections (STIs) Get screened for STIs, including gonorrhea and chlamydia, if: You are sexually active and are younger than 51 years of age. You are older than 51 years of age and your health care provider tells you that you are at risk for this type of infection. Your sexual activity has changed since you were last screened, and you are at increased risk for chlamydia or gonorrhea. Ask your health care provider if you are at risk. Ask your health care provider about whether you are at  high risk for HIV. Your health care provider may recommend a prescription medicine to help prevent HIV infection. If you choose to take medicine to prevent HIV, you should first get tested for HIV. You should then be tested every 3 months for as long as you are taking the medicine. Pregnancy If you are about to stop having your  period (premenopausal) and you may become pregnant, seek counseling before you get pregnant. Take 400 to 800 micrograms (mcg) of folic acid every day if you become pregnant. Ask for birth control (contraception) if you want to prevent pregnancy. Osteoporosis and menopause Osteoporosis is a disease in which the bones lose minerals and strength with aging. This can result in bone fractures. If you are 41 years old or older, or if you are at risk for osteoporosis and fractures, ask your health care provider if you should: Be screened for bone loss. Take a calcium or vitamin D  supplement to lower your risk of fractures. Be given hormone replacement therapy (HRT) to treat symptoms of menopause. Follow these instructions at home: Alcohol use Do not drink alcohol if: Your health care provider tells you not to drink. You are pregnant, may be pregnant, or are planning to become pregnant. If you drink alcohol: Limit how much you have to: 0-1 drink a day. Know how much alcohol is in your drink. In the [place] one drink equals one 12 oz bottle of beer (355 mL), one 5 oz glass of wine (148 mL), or one 1 oz glass of hard liquor (44 mL). Lifestyle Do not use any products that contain nicotine or tobacco. These products include cigarettes, chewing tobacco, and vaping devices, such as e-cigarettes. If you need help quitting, ask your health care provider. Do not use street drugs. Do not share needles. Ask your health care provider for help if you need support or information about quitting drugs. General instructions Schedule regular health, dental, and eye exams. Stay current with your vaccines. Tell your health care provider if: You often feel depressed. You have ever been abused or do not feel safe at home. Summary Adopting a healthy lifestyle and getting preventive care are important in promoting health and wellness. Follow your health care provider's instructions about healthy diet, exercising, and  getting tested or screened for diseases. Follow your health care provider's instructions on monitoring your cholesterol and blood pressure. This information is not intended to replace advice given to you by your health care provider. Make sure you discuss any questions you have with your health care provider. Document Revised: 01/11/2021 Document Reviewed: 01/11/2021 Elsevier Patient Education  2024 [employer].

## 2023-10-17 ENCOUNTER — Telehealth: Payer: Self-pay | Admitting: Family Medicine

## 2023-10-17 NOTE — Telephone Encounter (Signed)
Copied from CRM [id number]. Topic: General - Other >> Oct 17, 2023 12:32 PM Gurney Maxin H wrote: Reason for CRM: Patient is calling to ask for a note to excuse from jury duty due to hemorrhoids and arthritis in her back.If so it can be uploaded into MyChart.  Brunilda [phone]

## 2023-10-18 ENCOUNTER — Encounter: Payer: Self-pay | Admitting: *Deleted

## 2023-10-18 NOTE — Telephone Encounter (Signed)
Copied from CRM [id number]. Topic: Clinical - Pink Word Triage >> Oct 18, 2023  9:09 AM Fredrich Romans wrote: Reason for Triage: on going hemorrhoids

## 2023-10-18 NOTE — Telephone Encounter (Signed)
Left a detailed message at the patient's cell number stating the letter was left at the front desk for pick up.

## 2023-10-18 NOTE — Telephone Encounter (Signed)
Ok to write letter. I will sign.

## 2023-10-20 ENCOUNTER — Encounter: Payer: Self-pay | Admitting: Family Medicine

## 2023-11-03 ENCOUNTER — Telehealth: Payer: Self-pay

## 2023-11-03 NOTE — Telephone Encounter (Signed)
 Copied from CRM [id number]. Topic: Appointments - Scheduling Inquiry for Clinic >> Nov 03, 2023 10:45 AM Alcus Dad wrote: Reason for CRM: Patient hasn't heard anything back from [facility] referral. Patient is trying to schedule appt.

## 2023-11-10 ENCOUNTER — Other Ambulatory Visit: Payer: Self-pay | Admitting: Family Medicine

## 2023-11-10 DIAGNOSIS — I10 Essential (primary) hypertension: Secondary | ICD-10-CM

## 2023-12-13 NOTE — Progress Notes (Deleted)
 Chief Complaint: Primary GI MD:Dr. Adela Lank  HPI:  *** is a  ***  who was referred to me by Karie Georges, MD for a complaint of *** .     Discussed the use of AI scribe software for clinical note transcription with the patient, who gave verbal consent to proceed.  History of Present Illness      PREVIOUS GI WORKUP   Colonoscopy 2022 for rectal bleeding - external/internal hemorrhoids - otherwise normal - repeat 10 years.  Past Medical History:  Diagnosis Date   Anemia    Arthritis    Cataract    Heart murmur    as child, no problem   Hypertension    IBS (irritable bowel syndrome)     Past Surgical History:  Procedure Laterality Date   ABDOMINAL HYSTERECTOMY  10/21/2011   Procedure: HYSTERECTOMY ABDOMINAL;  Surgeon: Brock Bad, MD;  Location: [facility];  Service: Gynecology;  Laterality: N/A;  Right oophorectomy   EYE SURGERY  2013   [facility]    SVD     x 3   TUBAL LIGATION  06/2001   WISDOM TOOTH EXTRACTION      Current Outpatient Medications  Medication Sig Dispense Refill   Cholecalciferol (EQL VITAMIN D3) 50 MCG (2000 UT) CAPS Take by mouth.     cyanocobalamin 1000 MCG tablet Take 2,000 mcg by mouth daily.     estradiol (ESTRACE) 1 MG tablet Take 1 tablet (1 mg total) by mouth daily. 90 tablet 1   ibuprofen (ADVIL) 800 MG tablet Take 1 tablet (800 mg total) by mouth every 8 (eight) hours as needed. 90 tablet 2   lisinopril-hydrochlorothiazide (ZESTORETIC) 20-25 MG tablet TAKE 1 TABLET BY MOUTH DAILY 90 tablet 1   omeprazole (PRILOSEC) 20 MG capsule Take 1 capsule (20 mg total) by mouth daily. 90 capsule 1   tiZANidine (ZANAFLEX) 4 MG tablet Take 1 tablet (4 mg total) by mouth every 8 (eight) hours as needed for muscle spasms. 30 tablet 2   No current facility-administered medications for this visit.    Allergies as of 12/14/2023   (No Known Allergies)    Family History  Problem Relation Age of Onset   Hyperlipidemia Mother     Hypertension Mother    Stroke Mother    Hypertension Father    Hyperlipidemia Sister    Pancreatic cancer Maternal Uncle    Prostate cancer Maternal Grandfather    Colon cancer Neg Hx    Esophageal cancer Neg Hx    Rectal cancer Neg Hx    Stomach cancer Neg Hx     Social History   Socioeconomic History   Marital status: Single    Spouse name: Not on file   Number of children: 3   Years of education: Not on file   Highest education level: Not on file  Occupational History   Not on file  Tobacco Use   Smoking status: Never   Smokeless tobacco: Never  Vaping Use   Vaping status: Never Used  Substance and Sexual Activity   Alcohol use: Yes    Alcohol/week: 0.0 standard drinks of alcohol    Comment: socially   Drug use: Never   Sexual activity: Yes    Birth control/protection: Surgical    Comment: hysterectomy   Other Topics Concern   Not on file  Social History Narrative   Not on file   Social Drivers of Health   Financial Resource Strain: Not on file  Food  Insecurity: Not on file  Transportation Needs: Not on file  Physical Activity: Not on file  Stress: Not on file  Social Connections: Not on file  Intimate Partner Violence: Not on file    Review of Systems:    Constitutional: No weight loss, fever, chills, weakness or fatigue HEENT: Eyes: No change in vision               Ears, Nose, Throat:  No change in hearing or congestion Skin: No rash or itching Cardiovascular: No chest pain, chest pressure or palpitations   Respiratory: No SOB or cough Gastrointestinal: See HPI and otherwise negative Genitourinary: No dysuria or change in urinary frequency Neurological: No headache, dizziness or syncope Musculoskeletal: No new muscle or joint pain Hematologic: No bleeding or bruising Psychiatric: No history of depression or anxiety    Physical Exam:  Vital signs: LMP 10/14/2011   Constitutional: NAD, Well developed, Well nourished, alert and  cooperative Head:  Normocephalic and atraumatic. Eyes:   PEERL, EOMI. No icterus. Conjunctiva pink. Respiratory: Respirations even and unlabored. Lungs clear to auscultation bilaterally.   No wheezes, crackles, or rhonchi.  Cardiovascular:  Regular rate and rhythm. No peripheral edema, cyanosis or pallor.  Gastrointestinal:  Soft, nondistended, nontender. No rebound or guarding. Normal bowel sounds. No appreciable masses or hepatomegaly. Rectal:  Not performed.  Msk:  Symmetrical without gross deformities. Without edema, no deformity or joint abnormality.  Neurologic:  Alert and  oriented x4;  grossly normal neurologically.  Skin:   Dry and intact without significant lesions or rashes. Psychiatric: Oriented to person, place and time. Demonstrates good judgement and reason without abnormal affect or behaviors.  Physical Exam    RELEVANT LABS AND IMAGING: CBC    Component Value Date/Time   WBC 2.9 (L) 10/13/2023 1100   RBC 4.75 10/13/2023 1100   HGB 12.9 10/13/2023 1100   HCT 39.6 10/13/2023 1100   PLT 223.0 10/13/2023 1100   MCV 83.3 10/13/2023 1100   MCH 26.7 (L) 09/19/2022 1545   MCHC 32.6 10/13/2023 1100   RDW 14.1 10/13/2023 1100   LYMPHSABS 1.0 10/13/2023 1100   MONOABS 0.1 10/13/2023 1100   EOSABS 0.1 10/13/2023 1100   BASOSABS 0.0 10/13/2023 1100    CMP     Component Value Date/Time   NA 140 10/13/2023 1100   K 3.7 10/13/2023 1100   CL 103 10/13/2023 1100   CO2 25 10/13/2023 1100   GLUCOSE 84 10/13/2023 1100   GLUCOSE 86 07/25/2006 1102   BUN 12 10/13/2023 1100   CREATININE 1.02 10/13/2023 1100   CREATININE 0.98 09/19/2022 1545   CALCIUM 9.7 10/13/2023 1100   PROT 7.3 10/13/2023 1100   ALBUMIN 4.2 10/13/2023 1100   AST 23 10/13/2023 1100   ALT 13 10/13/2023 1100   ALKPHOS 66 10/13/2023 1100   BILITOT 0.6 10/13/2023 1100   GFRNONAA >90 10/22/2011 0512   GFRAA >90 10/22/2011 0512     Assessment/Plan:   IBS-C Last seen in 2019 and recommended miralax.  Colonoscopy 2022 for rectal bleeding showed external/internal hemorrhoids, otherwise normal with repeat 2032. Recent CBC, CMP, TSH, normal.  GERD On omeprazole 20mg  daily. Assessment & Plan        Lara Mulch [facility] 12/13/2023, 12:11 PM  Cc: Karie Georges, MD

## 2023-12-14 ENCOUNTER — Ambulatory Visit: Payer: 59 | Admitting: Gastroenterology

## 2024-02-12 ENCOUNTER — Encounter: Payer: Self-pay | Admitting: Obstetrics

## 2024-02-12 ENCOUNTER — Ambulatory Visit: Admitting: Obstetrics

## 2024-02-12 VITALS — BP 133/89 | HR 62 | Ht 66.5 in | Wt 154.8 lb

## 2024-02-12 DIAGNOSIS — K5909 Other constipation: Secondary | ICD-10-CM

## 2024-02-12 DIAGNOSIS — Z780 Asymptomatic menopausal state: Secondary | ICD-10-CM | POA: Diagnosis not present

## 2024-02-12 DIAGNOSIS — Z90710 Acquired absence of both cervix and uterus: Secondary | ICD-10-CM | POA: Diagnosis not present

## 2024-02-12 DIAGNOSIS — Z1331 Encounter for screening for depression: Secondary | ICD-10-CM | POA: Diagnosis not present

## 2024-02-12 DIAGNOSIS — K649 Unspecified hemorrhoids: Secondary | ICD-10-CM

## 2024-02-12 DIAGNOSIS — K581 Irritable bowel syndrome with constipation: Secondary | ICD-10-CM

## 2024-02-12 DIAGNOSIS — Z113 Encounter for screening for infections with a predominantly sexual mode of transmission: Secondary | ICD-10-CM | POA: Diagnosis not present

## 2024-02-12 DIAGNOSIS — Z01419 Encounter for gynecological examination (general) (routine) without abnormal findings: Secondary | ICD-10-CM

## 2024-02-12 MED ORDER — DOCUSATE SODIUM 100 MG PO CAPS: 100.0000 mg | ORAL_CAPSULE | Freq: Two times a day (BID) | ORAL | 11 refills | Status: AC

## 2024-02-12 MED ORDER — DICYCLOMINE HCL 10 MG PO CAPS: 10.0000 mg | ORAL_CAPSULE | Freq: Two times a day (BID) | ORAL | 11 refills | Status: AC

## 2024-02-12 NOTE — Progress Notes (Signed)
 Subjective:        Michele Gilmore is a 53 y.o. female here for a routine exam.  Current complaints: Hemorrhoidal discomfort..    Personal health questionnaire:  Is patient Ashkenazi Jewish, have a family history of breast and/or ovarian cancer: no Is there a family history of uterine cancer diagnosed at age < 60, gastrointestinal cancer, urinary tract cancer, family member who is a Personnel officer syndrome-associated carrier: no Is the patient overweight and hypertensive, family history of diabetes, personal history of gestational diabetes, preeclampsia or PCOS: no Is patient over 35, have PCOS,  family history of premature CHD under age 62, diabetes, smoke, have hypertension or peripheral artery disease:  no At any time, has a partner hit, kicked or otherwise hurt or frightened you?: no Over the past 2 weeks, have you felt down, depressed or hopeless?: no Over the past 2 weeks, have you felt little interest or pleasure in doing things?:no   Gynecologic History Patient's last menstrual period was 10/14/2011. Contraception: status post hysterectomy Last Pap: 2013. Results were: normal Last mammogram: 2021. Results were: normal  Obstetric History OB History  Gravida Para Term Preterm AB Living  5 3 3  0 2 3  SAB IAB Ectopic Multiple Live Births  2 0 0 0 3    # Outcome Date GA Lbr Len/2nd Weight Sex Type Anes PTL Lv  5 Term 05/09/01 [redacted]w[redacted]d  7 lb 12 oz (3.515 kg) M Vag-Spont None  LIV  4 SAB 2000 [redacted]w[redacted]d       DEC  3 Term 08/20/92 [redacted]w[redacted]d  7 lb 14 oz (3.572 kg) F Vag-Spont None  LIV  2 SAB 1992 [redacted]w[redacted]d       DEC  1 Term 01/28/89   7 lb 5 oz (3.317 kg) M Vag-Spont None  LIV    Past Medical History:  Diagnosis Date   Anemia    Arthritis    Cataract    Heart murmur    as child, no problem   Hypertension    IBS (irritable bowel syndrome)     Past Surgical History:  Procedure Laterality Date   ABDOMINAL HYSTERECTOMY  10/21/2011   Procedure: HYSTERECTOMY ABDOMINAL;  Surgeon: Gabrielle Joiner, MD;  Location: [facility];  Service: Gynecology;  Laterality: N/A;  Right oophorectomy   EYE SURGERY  2013   [facility]    SVD     x 3   TUBAL LIGATION  06/2001   WISDOM TOOTH EXTRACTION       Current Outpatient Medications:    Cholecalciferol (EQL VITAMIN D3) 50 MCG (2000 UT) CAPS, Take by mouth., Disp: , Rfl:    cyanocobalamin 1000 MCG tablet, Take 2,000 mcg by mouth daily., Disp: , Rfl:    dicyclomine  (BENTYL ) 10 MG capsule, Take 1 capsule (10 mg total) by mouth 2 (two) times daily., Disp: 60 capsule, Rfl: 11   docusate sodium  (COLACE) 100 MG capsule, Take 1 capsule (100 mg total) by mouth 2 (two) times daily., Disp: 60 capsule, Rfl: 11   estradiol  (ESTRACE ) 1 MG tablet, Take 1 tablet (1 mg total) by mouth daily., Disp: 90 tablet, Rfl: 1   lisinopril -hydrochlorothiazide  (ZESTORETIC ) 20-25 MG tablet, TAKE 1 TABLET BY MOUTH DAILY, Disp: 90 tablet, Rfl: 1   ibuprofen  (ADVIL ) 800 MG tablet, Take 1 tablet (800 mg total) by mouth every 8 (eight) hours as needed. (Patient not taking: Reported on 02/12/2024), Disp: 90 tablet, Rfl: 2   omeprazole  (PRILOSEC) 20 MG capsule, Take 1 capsule (20  mg total) by mouth daily. (Patient not taking: Reported on 02/12/2024), Disp: 90 capsule, Rfl: 1   tiZANidine  (ZANAFLEX ) 4 MG tablet, Take 1 tablet (4 mg total) by mouth every 8 (eight) hours as needed for muscle spasms. (Patient not taking: Reported on 02/12/2024), Disp: 30 tablet, Rfl: 2 No Known Allergies  Social History   Tobacco Use   Smoking status: Never   Smokeless tobacco: Never  Substance Use Topics   Alcohol use: Yes    Alcohol/week: 0.0 standard drinks of alcohol    Comment: socially    Family History  Problem Relation Age of Onset   Hyperlipidemia Mother    Hypertension Mother    Stroke Mother    Hypertension Father    Hyperlipidemia Sister    Pancreatic cancer Maternal Uncle    Prostate cancer Maternal Grandfather    Colon cancer Neg Hx    Esophageal cancer Neg Hx    Rectal  cancer Neg Hx    Stomach cancer Neg Hx       Review of Systems  Constitutional: negative for fatigue and weight loss Respiratory: negative for cough and wheezing Cardiovascular: negative for chest pain, fatigue and palpitations Gastrointestinal: negative for abdominal pain and change in bowel habits Musculoskeletal:negative for myalgias Neurological: negative for gait problems and tremors Behavioral/Psych: negative for abusive relationship, depression Endocrine: negative for temperature intolerance    Genitourinary:negative for abnormal menstrual periods, genital lesions, hot flashes, sexual problems and vaginal discharge Integument/breast: negative for breast lump, breast tenderness, nipple discharge and skin lesion(s)    Objective:       BP 133/89   Pulse 62   Ht 5' 6.5" (1.689 m)   Wt 154 lb 12.8 oz (70.2 kg)   LMP 10/14/2011   BMI 24.61 kg/m  General:   Alert and no distress  Skin:   no rash or abnormalities  Lungs:   clear to auscultation bilaterally  Heart:   regular rate and rhythm, S1, S2 normal, no murmur, click, rub or gallop  Breasts:   normal without suspicious masses, skin or nipple changes or axillary nodes  Abdomen:  normal findings: no organomegaly, soft, non-tender and no hernia  Pelvis:  External genitalia: normal general appearance Urinary system: urethral meatus normal and bladder without fullness, nontender Vaginal: normal without tenderness, induration or masses Cervix: absent Adnexa: normal bimanual exam Uterus: absent   Lab Review Urine pregnancy test Labs reviewed yes Radiologic studies reviewed yes  I have spent a total of 20 minutes of face-to-face time, excluding clinical staff time, reviewing notes and preparing to see patient, ordering tests and/or medications, and counseling the patient.   Assessment:    1. Encounter for annual routine gynecological examination (Primary)  2. Menopause - clinically stable  3. History of  hysterectomy for symptomatic uterine fibroids  4. Screening examination for STD (sexually transmitted disease) Rx: - HIV antibody (with reflex) - RPR - Hepatitis C Antibody - Hepatitis B Surface AntiGEN  5. Irritable bowel syndrome with constipation - followed by GI.  Has been on Linzess  Rx: - dicyclomine  (BENTYL ) 10 MG capsule; Take 1 capsule (10 mg total) by mouth 2 (two) times daily.  Dispense: 60 capsule; Refill: 11  6. Other constipation Rx: - docusate sodium  (COLACE) 100 MG capsule; Take 1 capsule (100 mg total) by mouth 2 (two) times daily.  Dispense: 60 capsule; Refill: 11  7. Hemorrhoids, internal, symptomatic - followed by GI - needs General Surgery consultation    Plan:    Education reviewed:  calcium supplements, depression evaluation, low fat, low cholesterol diet, safe sex/STD prevention, self breast exams, and weight bearing exercise. Mammogram ordered. Follow up in: 1 year.    Orders Placed This Encounter  Procedures   HIV antibody (with reflex)   RPR   Hepatitis C Antibody   Hepatitis B Surface AntiGEN    Gabrielle Joiner, MD, FACOG Attending Obstetrician & Gynecologist, [facility] for [facility], [facility], [place] 02/12/2024

## 2024-02-12 NOTE — Progress Notes (Signed)
 Pt presents for annual. Pt only wants blood std testing. Pt has no questions or concerns at this time.

## 2024-02-13 LAB — HEPATITIS C ANTIBODY: Hep C Virus Ab: NONREACTIVE

## 2024-02-13 LAB — HEPATITIS B SURFACE ANTIGEN: Hepatitis B Surface Ag: NEGATIVE

## 2024-02-13 LAB — RPR: RPR Ser Ql: NONREACTIVE

## 2024-02-13 LAB — HIV ANTIBODY (ROUTINE TESTING W REFLEX): HIV Screen 4th Generation wRfx: NONREACTIVE

## 2024-02-15 ENCOUNTER — Ambulatory Visit: Admitting: Obstetrics

## 2024-02-16 ENCOUNTER — Ambulatory Visit
Admission: RE | Admit: 2024-02-16 | Discharge: 2024-02-16 | Disposition: A | Discharge status: Home / Self Care | Source: Ambulatory Visit | Attending: Family Medicine | Admitting: Family Medicine

## 2024-02-16 DIAGNOSIS — Z1231 Encounter for screening mammogram for malignant neoplasm of breast: Secondary | ICD-10-CM

## 2024-02-20 ENCOUNTER — Ambulatory Visit: Payer: Self-pay | Admitting: Family Medicine

## 2024-02-23 ENCOUNTER — Ambulatory Visit

## 2024-02-23 ENCOUNTER — Ambulatory Visit (INDEPENDENT_AMBULATORY_CARE_PROVIDER_SITE_OTHER): Admitting: Family Medicine

## 2024-02-23 VITALS — BP 134/90 | HR 73 | Temp 97.9°F | Ht 66.5 in | Wt 153.1 lb

## 2024-02-23 DIAGNOSIS — R109 Unspecified abdominal pain: Secondary | ICD-10-CM

## 2024-02-23 DIAGNOSIS — G8929 Other chronic pain: Secondary | ICD-10-CM | POA: Diagnosis not present

## 2024-02-23 DIAGNOSIS — M545 Low back pain, unspecified: Secondary | ICD-10-CM | POA: Diagnosis not present

## 2024-02-23 LAB — POCT URINALYSIS DIPSTICK
Bilirubin, UA: NEGATIVE
Blood, UA: NEGATIVE
Glucose, UA: NEGATIVE
Ketones, UA: NEGATIVE
Leukocytes, UA: NEGATIVE
Nitrite, UA: NEGATIVE
Protein, UA: NEGATIVE
Spec Grav, UA: 1.02 (ref 1.010–1.025)
Urobilinogen, UA: 0.2 U/dL
pH, UA: 7 (ref 5.0–8.0)

## 2024-02-23 MED ORDER — METHYLPREDNISOLONE 4 MG PO TBPK: ORAL_TABLET | ORAL | 0 refills | Status: AC

## 2024-02-23 MED ORDER — MELOXICAM 15 MG PO TABS: 15.0000 mg | ORAL_TABLET | Freq: Every day | ORAL | 5 refills | Status: AC

## 2024-02-23 NOTE — Progress Notes (Signed)
 Established Patient Office Visit  Subjective   Patient ID: Michele Gilmore, female    DOB: [birth year]  Age: 53 y.o. MRN: [id number]  Chief Complaint  Patient presents with  . Back Pain    Pt c/o lower back pain. Going on couple of months. Sx worsen when sleep on L side. Taking ibuprofen  800mg . Sometimes has cramping ache    Pt reports left sided buttock pain, sometimes comes around to the front, lately it has been a steady ache, doesn't feels constipated, sometimes feels like she has to urinate but sometimes she doesn't, no fever or chills, no nausea or vomiting. Hurts the most if she lays on her left side, states that it comes and goes. Ibuprofen  does help a little, right now she is not in any current pain. Starts in her buttock and goes up to the mid back, states when she twists or moves it tends to hurt more.  No radiation of the pain down the leg. Had some muscle relaxers at home and tried them but they did not help. Pt states this is the same pain that happened last year, but it seems to have gotten worse because the muscle relaxers are not helping and the pain is more persistent.   Current Outpatient Medications  Medication Instructions  . Cholecalciferol (EQL VITAMIN D3) 50 MCG (2000 UT) CAPS Take by mouth.  . cyanocobalamin 2,000 mcg, Daily  . dicyclomine  (BENTYL ) 10 mg, Oral, 2 times daily  . docusate sodium  (COLACE) 100 mg, Oral, 2 times daily  . estradiol  (ESTRACE ) 1 mg, Oral, Daily  . ibuprofen  (ADVIL ) 800 mg, Oral, Every 8 hours PRN  . lisinopril -hydrochlorothiazide  (ZESTORETIC ) 20-25 MG tablet 1 tablet, Oral, Daily  . meloxicam  (MOBIC ) 15 mg, Oral, Daily  . methylPREDNISolone (MEDROL DOSEPAK) 4 MG TBPK tablet Take package as directed.  . omeprazole  (PRILOSEC) 20 mg, Oral, Daily  . tiZANidine  (ZANAFLEX ) 4 mg, Oral, Every 8 hours PRN    Patient Active Problem List   Diagnosis Date Noted  . Chronic left-sided low back pain without sciatica 01/11/2023  . Menopausal  flushing 01/11/2023  . Overweight with body mass index (BMI) of 28 to 28.9 in adult 09/27/2021  . OA (osteoarthritis) of knee 03/28/2019  . IBS (irritable bowel syndrome) 03/28/2019  . GERD (gastroesophageal reflux disease) 03/28/2019  . Essential hypertension 11/09/2007      Review of Systems  All other systems reviewed and are negative.     Objective:     BP (!) 134/90 (BP Location: Left Arm, Patient Position: Sitting)   Pulse 73   Temp 97.9 F (36.6 C) (Oral)   Ht 5' 6.5 (1.689 m)   Wt 153 lb 1.6 oz (69.4 kg)   LMP 10/14/2011   SpO2 99%   BMI 24.34 kg/m    Physical Exam Vitals reviewed.  Constitutional:      Appearance: Normal appearance. She is normal weight.  Pulmonary:     Effort: Pulmonary effort is normal.  Abdominal:     Tenderness: There is no left CVA tenderness.   Musculoskeletal:        General: Tenderness (tenderness to palpation of the left flank, paraspinal muscles) present. No swelling or deformity. Normal range of motion.   Neurological:     Mental Status: She is alert and oriented to person, place, and time. Mental status is at baseline.   Psychiatric:        Mood and Affect: Mood normal.        Behavior:  Behavior normal.     Results for orders placed or performed in visit on 02/23/24  POC Urinalysis Dipstick  Result Value Ref Range   Color, UA dark yellow    Clarity, UA clear    Glucose, UA Negative Negative   Bilirubin, UA Negative    Ketones, UA Negative    Spec Grav, UA 1.020 1.010 - 1.025   Blood, UA Negative    pH, UA 7.0 5.0 - 8.0   Protein, UA Negative Negative   Urobilinogen, UA 0.2 0.2 or 1.0 E.U./dL   Nitrite, UA negative    Leukocytes, UA Negative Negative   Appearance     Odor        The 10-year ASCVD risk score (Arnett DK, et al., 2019) is: 2.4%    Assessment & Plan:  Left flank pain -     POCT urinalysis dipstick  Chronic left-sided low back pain without sciatica -     DG Lumbar Spine Complete; Future -      DG Sacrum/Coccyx; Future -     Meloxicam ; Take 1 tablet (15 mg total) by mouth daily.  Dispense: 30 tablet; Refill: 5 -     methylPREDNISolone; Take package as directed.  Dispense: 21 each; Refill: 0   Urine is negative for stones/infection. This pain is chronic and ongoing, progressively worsening, now not responding to muscle relaxers and the ibupofen is losing effectiveness. Will check X-rays and given meloxicam  and medrol dose pak to reduce inflammation. She may need a course of PT depending on the results of the x-rays  No follow-ups on file.    Aida House, MD

## 2024-03-01 ENCOUNTER — Telehealth: Payer: Self-pay

## 2024-03-01 NOTE — Telephone Encounter (Signed)
 Copied from CRM [id number]. Topic: General - Other >> Mar 01, 2024 10:51 AM Michele Gilmore wrote: Reason for CRM: Patient called in regarding imaging results, would like a callback once they have came back

## 2024-03-01 NOTE — Telephone Encounter (Signed)
 Please let pt know that they have not yet been read by the radiologist and as soon as they are available I will let her know

## 2024-03-01 NOTE — Telephone Encounter (Signed)
Left a detailed message at the patient's cell number with the information below.   

## 2024-03-04 ENCOUNTER — Ambulatory Visit: Payer: Self-pay | Admitting: Family Medicine

## 2024-03-04 ENCOUNTER — Telehealth: Payer: Self-pay

## 2024-03-04 NOTE — Telephone Encounter (Signed)
 Copied from CRM [id number]. Topic: General - Call Back - No Documentation >> Mar 04, 2024  2:48 PM Geneva B wrote: Reason for RMF:[facility] is calling for  patient wants a prior authorization mri call [facility] [phone]

## 2024-03-13 ENCOUNTER — Other Ambulatory Visit: Payer: Self-pay

## 2024-03-13 DIAGNOSIS — K649 Unspecified hemorrhoids: Secondary | ICD-10-CM

## 2024-03-13 NOTE — Progress Notes (Signed)
 Pt called in wanting to know about next steps from 6/9 visit. Per Dr. Rudy notes pt does not have to f/u for one year. Pt is followed by GI, but Dr. Rudy stated pt needed general surgery consult. Order placed today 7/9.

## 2024-03-25 ENCOUNTER — Ambulatory Visit: Admitting: Gastroenterology

## 2024-03-25 ENCOUNTER — Ambulatory Visit

## 2024-03-25 VITALS — BP 134/89 | HR 62 | Ht 66.0 in | Wt 154.8 lb

## 2024-03-25 DIAGNOSIS — N898 Other specified noninflammatory disorders of vagina: Secondary | ICD-10-CM | POA: Diagnosis not present

## 2024-03-25 LAB — POCT URINALYSIS DIPSTICK
Bilirubin, UA: NEGATIVE
Blood, UA: NEGATIVE
Glucose, UA: NEGATIVE
Ketones, UA: NEGATIVE
Leukocytes, UA: NEGATIVE
Nitrite, UA: NEGATIVE
Protein, UA: POSITIVE — AB
Spec Grav, UA: 1.02 (ref 1.010–1.025)
Urobilinogen, UA: 0.2 U/dL
pH, UA: 6 (ref 5.0–8.0)

## 2024-03-25 NOTE — Progress Notes (Signed)
 SUBJECTIVE: Michele Gilmore is a 53 y.o. female who complains of urinary frequency, urgency and dysuria x 30 days, without flank pain, fever, chills, or abnormal vaginal discharge or bleeding.   OBJECTIVE: Appears well, in no apparent distress.  Vital signs are normal. Urine dipstick shows positive for protein.    ASSESSMENT: Dysuria  PLAN: Treatment per orders.  Call or return to clinic prn if these symptoms worsen or fail to improve as anticipated.

## 2024-03-27 LAB — URINE CULTURE

## 2024-04-01 ENCOUNTER — Telehealth: Payer: Self-pay | Admitting: *Deleted

## 2024-04-01 DIAGNOSIS — M545 Low back pain, unspecified: Secondary | ICD-10-CM

## 2024-04-01 NOTE — Telephone Encounter (Unsigned)
 Copied from CRM #[id number]. Topic: Clinical - Request for Lab/Test Order >> Apr 01, 2024 10:50 AM Franky GRADE wrote: Reason for CRM: Patient is calling requesting an order of an mri lower back, due to pain she is experiencing. Dr.Michael is aware of the symptoms and has had x-rays previously.

## 2024-04-01 NOTE — Telephone Encounter (Signed)
 I can try to order one however insurance sometimes will require the patient to go to physical therapy first-- I will place the order in the computer.

## 2024-04-02 NOTE — Telephone Encounter (Signed)
 Unable to leave a message due to voicemail being full.

## 2024-04-04 NOTE — Telephone Encounter (Signed)
 Noted

## 2024-04-04 NOTE — Telephone Encounter (Signed)
 Unable to leave a message due to voicemail being full.  Mychart message sent with information as below.

## 2024-04-12 ENCOUNTER — Ambulatory Visit
Admission: RE | Admit: 2024-04-12 | Discharge: 2024-04-12 | Disposition: A | Discharge status: Home / Self Care | Source: Ambulatory Visit | Attending: Family Medicine | Admitting: Family Medicine

## 2024-04-12 DIAGNOSIS — M545 Low back pain, unspecified: Secondary | ICD-10-CM

## 2024-04-24 ENCOUNTER — Ambulatory Visit: Payer: Self-pay | Admitting: Family Medicine

## 2024-05-27 ENCOUNTER — Ambulatory Visit: Admitting: Gastroenterology

## 2024-05-27 NOTE — Progress Notes (Deleted)
 HPI :  Last seen 10/2020: Historically had recommended miralax  for constipation  Colonoscopy 11/02/20: - Hemorrhoids were found on perianal exam. - The terminal ileum appeared normal. - Internal hemorrhoids were found during retroflexion and were inflamed. - The colon was tortous. The exam was otherwise without abnormality  Past Medical History:  Diagnosis Date   Anemia    Arthritis    Cataract    GERD (gastroesophageal reflux disease)    Heart murmur    as child, no problem   Hypertension    IBS (irritable bowel syndrome)    constipation     Past Surgical History:  Procedure Laterality Date   ABDOMINAL HYSTERECTOMY  10/21/2011   Procedure: HYSTERECTOMY ABDOMINAL;  Surgeon: Carlin DELENA Centers, MD;  Location: [facility];  Service: Gynecology;  Laterality: N/A;  Right oophorectomy   EYE SURGERY  2013   [facility]    SVD     x 3   TUBAL LIGATION  06/2001   WISDOM TOOTH EXTRACTION     Family History  Problem Relation Age of Onset   Hyperlipidemia Mother    Hypertension Mother    Stroke Mother    Hypertension Father    Hyperlipidemia Sister    Pancreatic cancer Maternal Uncle    Prostate cancer Maternal Grandfather    Colon cancer Neg Hx    Esophageal cancer Neg Hx    Rectal cancer Neg Hx    Stomach cancer Neg Hx    Social History   Tobacco Use   Smoking status: Never   Smokeless tobacco: Never  Vaping Use   Vaping status: Never Used  Substance Use Topics   Alcohol use: Yes    Alcohol/week: 0.0 standard drinks of alcohol    Comment: socially   Drug use: Never   Current Outpatient Medications  Medication Sig Dispense Refill   Cholecalciferol (EQL VITAMIN D3) 50 MCG (2000 UT) CAPS Take by mouth.     cyanocobalamin 1000 MCG tablet Take 2,000 mcg by mouth daily.     dicyclomine  (BENTYL ) 10 MG capsule Take 1 capsule (10 mg total) by mouth 2 (two) times daily. (Patient not taking: Reported on 03/25/2024) 60 capsule 11   docusate sodium  (COLACE) 100 MG capsule  Take 1 capsule (100 mg total) by mouth 2 (two) times daily. (Patient not taking: Reported on 03/25/2024) 60 capsule 11   estradiol  (ESTRACE ) 1 MG tablet Take 1 tablet (1 mg total) by mouth daily. 90 tablet 1   ibuprofen  (ADVIL ) 800 MG tablet Take 1 tablet (800 mg total) by mouth every 8 (eight) hours as needed. 90 tablet 2   lisinopril -hydrochlorothiazide  (ZESTORETIC ) 20-25 MG tablet TAKE 1 TABLET BY MOUTH DAILY 90 tablet 1   meloxicam  (MOBIC ) 15 MG tablet Take 1 tablet (15 mg total) by mouth daily. (Patient not taking: Reported on 03/25/2024) 30 tablet 5   methylPREDNISolone  (MEDROL  DOSEPAK) 4 MG TBPK tablet Take package as directed. (Patient not taking: Reported on 03/25/2024) 21 each 0   omeprazole  (PRILOSEC) 20 MG capsule Take 1 capsule (20 mg total) by mouth daily. (Patient not taking: Reported on 03/25/2024) 90 capsule 1   tiZANidine  (ZANAFLEX ) 4 MG tablet Take 1 tablet (4 mg total) by mouth every 8 (eight) hours as needed for muscle spasms. (Patient not taking: Reported on 03/25/2024) 30 tablet 2   No current facility-administered medications for this visit.   No Known Allergies   Review of Systems: All systems reviewed and negative except where noted in HPI.  No results found.  Physical Exam: LMP 10/14/2011  Constitutional: Pleasant,well-developed, ***female in no acute distress. HEENT: Normocephalic and atraumatic. Conjunctivae are normal. No scleral icterus. Neck supple.  Cardiovascular: Normal rate, regular rhythm.  Pulmonary/chest: Effort normal and breath sounds normal. No wheezing, rales or rhonchi. Abdominal: Soft, nondistended, nontender. Bowel sounds active throughout. There are no masses palpable. No hepatomegaly. Extremities: no edema Lymphadenopathy: No cervical adenopathy noted. Neurological: Alert and oriented to person place and time. Skin: Skin is warm and dry. No rashes noted. Psychiatric: Normal mood and affect. Behavior is normal.   ASSESSMENT: 53 y.o.  female here for assessment of the following  No diagnosis found.  PLAN:   Michele Gilmore HERO, MD

## 2024-07-25 NOTE — Progress Notes (Signed)
 Ellouise Console, PA-C [address] [place], [place]  [address] Phone: [phone]   Gastroenterology Consultation  Referring Provider:     Ozell Heron HERO, MD Primary Care Physician:  Ozell Heron HERO, MD Primary Gastroenterologist:  Ellouise Console, PA-C / Elspeth Naval, MD  Reason for Consultation:     Hemorrhoids; Left sided abdominal pain; Rectal Bleeding        HPI:   Discussed the use of AI scribe software for clinical note transcription with the patient, who gave verbal consent to proceed.  10/2020 last colonoscopy by Dr. Naval (to evaluate rectal bleeding): Hemorrhoids found on perianal exam.  Inflamed internal hemorrhoids.  No polyps.  Torturous colon.  Good prep.  10-year repeat screening.  History of Present Illness Michele Gilmore is a 53 year old female with IBS who presents with abdominal pain and rectal bleeding.  She has been experiencing abdominal pain and rectal bleeding for the past few months. The pain is described as an aching sensation on her left side, particularly noticeable during digestion, and sometimes wakes her up at night. It is often accompanied by a cramping feeling. She also reports significant flare-ups of her hemorrhoids.  She has bowel movements at least twice a day, aided by herbal tea, and denies hard stools or straining. She can often predict when there will be blood in her stool, as her stomach will 'bubble' or 'gurgle' beforehand. No diarrhea is noted, with discomfort localized to the left side of her abdomen, left flank and left back.  Her past medical workup includes a colonoscopy performed in February 2022. She recalls being told that she did not have Crohn's disease and that internal hemorrhoids were seen.   In terms of family history, her mother has IBS, but there is no family history of colon cancer, IBD, or GI malignancies.  No unusual weight loss.   Past Medical History:  Diagnosis Date   Anemia    Arthritis     Cataract    GERD (gastroesophageal reflux disease)    Heart murmur    as child, no problem   Hypertension    IBS (irritable bowel syndrome)    constipation    Past Surgical History:  Procedure Laterality Date   ABDOMINAL HYSTERECTOMY  10/21/2011   Procedure: HYSTERECTOMY ABDOMINAL;  Surgeon: Carlin DELENA Centers, MD;  Location: [facility];  Service: Gynecology;  Laterality: N/A;  Right oophorectomy   EYE SURGERY  2013   [facility]    SVD     x 3   TUBAL LIGATION  06/2001   WISDOM TOOTH EXTRACTION      Prior to Admission medications   Medication Sig Start Date End Date Taking? Authorizing Provider  Cholecalciferol (EQL VITAMIN D3) 50 MCG (2000 UT) CAPS Take by mouth.    [provider]  cyanocobalamin 1000 MCG tablet Take 2,000 mcg by mouth daily.    [provider]  dicyclomine  (BENTYL ) 10 MG capsule Take 1 capsule (10 mg total) by mouth 2 (two) times daily. Patient not taking: Reported on 03/25/2024 02/12/24   Centers Carlin DELENA, MD  docusate sodium  (COLACE) 100 MG capsule Take 1 capsule (100 mg total) by mouth 2 (two) times daily. Patient not taking: Reported on 03/25/2024 02/12/24   Centers Carlin DELENA, MD  estradiol  (ESTRACE ) 1 MG tablet Take 1 tablet (1 mg total) by mouth daily. 10/13/23   Ozell Heron HERO, MD  ibuprofen  (ADVIL ) 800 MG tablet Take 1 tablet (800 mg  total) by mouth every 8 (eight) hours as needed. 01/09/23   Ozell Heron HERO, MD  lisinopril -hydrochlorothiazide  (ZESTORETIC ) 20-25 MG tablet TAKE 1 TABLET BY MOUTH DAILY 11/10/23   Ozell Heron HERO, MD  meloxicam  (MOBIC ) 15 MG tablet Take 1 tablet (15 mg total) by mouth daily. Patient not taking: Reported on 03/25/2024 02/23/24   Ozell Heron HERO, MD  methylPREDNISolone  (MEDROL  DOSEPAK) 4 MG TBPK tablet Take package as directed. Patient not taking: Reported on 03/25/2024 02/23/24   Ozell Heron HERO, MD  omeprazole  (PRILOSEC) 20 MG capsule Take 1 capsule (20 mg total) by mouth daily. Patient not taking:  Reported on 03/25/2024 10/13/23   Ozell Heron HERO, MD  tiZANidine  (ZANAFLEX ) 4 MG tablet Take 1 tablet (4 mg total) by mouth every 8 (eight) hours as needed for muscle spasms. Patient not taking: Reported on 03/25/2024 10/13/23   Ozell Heron HERO, MD    Family History  Problem Relation Age of Onset   Hyperlipidemia Mother    Hypertension Mother    Stroke Mother    Hypertension Father    Hyperlipidemia Sister    Pancreatic cancer Maternal Uncle    Prostate cancer Maternal Grandfather    Colon cancer Neg Hx    Esophageal cancer Neg Hx    Rectal cancer Neg Hx    Stomach cancer Neg Hx      Social History   Tobacco Use   Smoking status: Never   Smokeless tobacco: Never  Vaping Use   Vaping status: Never Used  Substance Use Topics   Alcohol use: Yes    Alcohol/week: 0.0 standard drinks of alcohol    Comment: socially   Drug use: Never    Allergies as of 07/26/2024   (No Known Allergies)    Review of Systems:    All systems reviewed and negative except where noted in HPI.   Physical Exam:  BP 130/62   Pulse 72   Ht 5' 6.5 (1.689 m)   Wt 156 lb 4 oz (70.9 kg)   LMP 10/14/2011   BMI 24.84 kg/m  Patient's last menstrual period was 10/14/2011.  General:   Alert,  Well-developed, well-nourished, pleasant and cooperative in NAD Lungs:  Respirations even and unlabored.  Clear throughout to auscultation.   No wheezes, crackles, or rhonchi. No acute distress. Heart:  Regular rate and rhythm; no murmurs, clicks, rubs, or gallops. Abdomen:  Normal bowel sounds.  No bruits.  Soft, and non-distended without masses, hepatosplenomegaly or hernias noted.  No Tenderness.  No guarding or rebound tenderness.    Neurologic:  Alert and oriented x3;  grossly normal neurologically. Psych:  Alert and cooperative. Normal mood and affect. Rectal:  NO external hemorrhoids or fissure.  No rashes or lesions.  No rectal masses or tenderness.  Palpable internal hemorrhoid.  Soft brown stool, trace  heme positive.  Chaperone for Exam:  Alethea Blocker, CMA   Imaging Studies: No results found.  Labs: CBC    Component Value Date/Time   WBC 2.9 (L) 10/13/2023 1100   RBC 4.75 10/13/2023 1100   HGB 12.9 10/13/2023 1100   HCT 39.6 10/13/2023 1100   PLT 223.0 10/13/2023 1100   MCV 83.3 10/13/2023 1100    CMP     Component Value Date/Time   NA 140 10/13/2023 1100   K 3.7 10/13/2023 1100   CL 103 10/13/2023 1100   CO2 25 10/13/2023 1100   GLUCOSE 84 10/13/2023 1100   GLUCOSE 86 07/25/2006 1102   BUN 12 10/13/2023  1100   CREATININE 1.02 10/13/2023 1100   CREATININE 0.98 09/19/2022 1545   CALCIUM 9.7 10/13/2023 1100   PROT 7.3 10/13/2023 1100   ALBUMIN 4.2 10/13/2023 1100   AST 23 10/13/2023 1100   ALT 13 10/13/2023 1100   ALKPHOS 66 10/13/2023 1100   BILITOT 0.6 10/13/2023 1100   GFRNONAA >90 10/22/2011 0512   GFRAA >90 10/22/2011 0512    Assessment and Plan:   Michele Gilmore is a 53 y.o. y/o female has been referred for: Assessment & Plan 1.  Left Sided Abdominal pain and cramping; Left Flank and Back Pain Intermittent left-sided abdominal pain and cramping associated with digestion. Differential includes irritable bowel syndrome, Crohn's disease, ulcerative colitis, and nephrolithiasis. Previous colonoscopy in 2022 was negative for Crohn's disease and polyps. - Labs: CBC, CMP, Lipase, CRP - Ordered CT scan of abdomen and pelvis.  2.  Rectal bleeding Most likely due to internal hemorrhoids Rectal bleeding likely from inflamed internal hemorrhoids. No polyps on previous colonoscopy. Further evaluation needed to rule out other causes. - Fecal Calprotectin - Provided treatment for hemorrhoids. - Consider repeat colonoscopy if fecal calprotectin is high, hemoglobin is low, or CT scan shows intestinal inflammation.  Also repeat colonoscopy if rectal bleeding persists.  3.  Internal Hemorrhoids: - Rx Hydrocortisone  Suppositories 25mg  Insert 1 into rectum once daily at  bedtime for 10-14 days. - Discussed Internal Hemorrhoid Banding if no improvement with conservative treament.    Follow up 6 weeks with TG.  Also follow-up based on above test results.  Ellouise Console, PA-C

## 2024-07-26 ENCOUNTER — Other Ambulatory Visit

## 2024-07-26 ENCOUNTER — Encounter: Payer: Self-pay | Admitting: Physician Assistant

## 2024-07-26 ENCOUNTER — Ambulatory Visit (INDEPENDENT_AMBULATORY_CARE_PROVIDER_SITE_OTHER): Admitting: Physician Assistant

## 2024-07-26 VITALS — BP 130/62 | HR 72 | Ht 66.5 in | Wt 156.2 lb

## 2024-07-26 DIAGNOSIS — K649 Unspecified hemorrhoids: Secondary | ICD-10-CM

## 2024-07-26 DIAGNOSIS — R109 Unspecified abdominal pain: Secondary | ICD-10-CM

## 2024-07-26 DIAGNOSIS — G8929 Other chronic pain: Secondary | ICD-10-CM

## 2024-07-26 DIAGNOSIS — K625 Hemorrhage of anus and rectum: Secondary | ICD-10-CM

## 2024-07-26 DIAGNOSIS — R10A2 Flank pain, left side: Secondary | ICD-10-CM | POA: Diagnosis not present

## 2024-07-26 DIAGNOSIS — K5900 Constipation, unspecified: Secondary | ICD-10-CM

## 2024-07-26 LAB — CBC WITH DIFFERENTIAL/PLATELET
Basophils Absolute: 0 K/uL (ref 0.0–0.1)
Basophils Relative: 0.7 % (ref 0.0–3.0)
Eosinophils Absolute: 0.1 K/uL (ref 0.0–0.7)
Eosinophils Relative: 1.9 % (ref 0.0–5.0)
HCT: 36.4 % (ref 36.0–46.0)
Hemoglobin: 12 g/dL (ref 12.0–15.0)
Lymphocytes Relative: 31 % (ref 12.0–46.0)
Lymphs Abs: 1.2 K/uL (ref 0.7–4.0)
MCHC: 32.8 g/dL (ref 30.0–36.0)
MCV: 81.3 fl (ref 78.0–100.0)
Monocytes Absolute: 0.2 K/uL (ref 0.1–1.0)
Monocytes Relative: 5.2 % (ref 3.0–12.0)
Neutro Abs: 2.3 K/uL (ref 1.4–7.7)
Neutrophils Relative %: 61.2 % (ref 43.0–77.0)
Platelets: 195 K/uL (ref 150.0–400.0)
RBC: 4.48 Mil/uL (ref 3.87–5.11)
RDW: 14.6 % (ref 11.5–15.5)
WBC: 3.7 K/uL — ABNORMAL LOW (ref 4.0–10.5)

## 2024-07-26 LAB — COMPREHENSIVE METABOLIC PANEL WITH GFR
ALT: 14 U/L (ref 0–35)
AST: 23 U/L (ref 0–37)
Albumin: 4 g/dL (ref 3.5–5.2)
Alkaline Phosphatase: 71 U/L (ref 39–117)
BUN: 13 mg/dL (ref 6–23)
CO2: 29 meq/L (ref 19–32)
Calcium: 9.2 mg/dL (ref 8.4–10.5)
Chloride: 106 meq/L (ref 96–112)
Creatinine, Ser: 1.12 mg/dL (ref 0.40–1.20)
GFR: 56.88 mL/min — ABNORMAL LOW (ref >60.00)
Glucose, Bld: 103 mg/dL — ABNORMAL HIGH (ref 70–99)
Potassium: 3.4 meq/L — ABNORMAL LOW (ref 3.5–5.1)
Sodium: 141 meq/L (ref 135–145)
Total Bilirubin: 0.4 mg/dL (ref 0.2–1.2)
Total Protein: 6.9 g/dL (ref 6.0–8.3)

## 2024-07-26 LAB — LIPASE: Lipase: 46 U/L (ref 11.0–59.0)

## 2024-07-26 LAB — C-REACTIVE PROTEIN: CRP: <0.5 mg/dL (ref 0.5–20.0)

## 2024-07-26 MED ORDER — HYDROCORTISONE ACETATE 25 MG RE SUPP: 25.0000 mg | Freq: Every day | RECTAL | 1 refills | Status: AC

## 2024-07-26 NOTE — Patient Instructions (Signed)
 Your provider has requested that you go to the basement level for lab work before leaving today. Press B on the elevator. The lab is located at the first door on the left as you exit the elevator.  We have sent the following medications to your pharmacy for you to pick up at your convenience: Hydrocortisone  Suppositories 25 mg once at bedtime  You have been scheduled for a CT scan of the abdomen and pelvis at [facility], 1st floor Radiology. You are scheduled on 08/05/24 at 2:30 pm for a 4:30 pm scan. You should arrive 15 minutes prior to your appointment time for registration. If you have any questions regarding your exam or if you need to reschedule, you may call [facility] at [phone] between the hours of 8:00 am and 5:00 pm, Monday-Friday.   Please follow up sooner if symptoms increase or worsen  Due to recent changes in healthcare laws, you may see the results of your imaging and laboratory studies on MyChart before your provider has had a chance to review them.  We understand that in some cases there may be results that are confusing or concerning to you. Not all laboratory results come back in the same time frame and the provider may be waiting for multiple results in order to interpret others.  Please give us  48 hours in order for your provider to thoroughly review all the results before contacting the office for clarification of your results.   Thank you for trusting me with your gastrointestinal care!   Ellouise Console, PA-C _______________________________________________________  If your blood pressure at your visit was 140/90 or greater, please contact your primary care physician to follow up on this.  _______________________________________________________  If you are age 86 or older, your body mass index should be between 23-30. Your Body mass index is 24.84 kg/m. If this is out of the aforementioned range listed, please consider follow up with  your Primary Care Provider.  If you are age 76 or younger, your body mass index should be between 19-25. Your Body mass index is 24.84 kg/m. If this is out of the aformentioned range listed, please consider follow up with your Primary Care Provider.   ________________________________________________________  The [facility] providers would like to encourage you to use MYCHART to communicate with providers for non-urgent requests or questions.  Due to long hold times on the telephone, sending your provider a message by [facility] may be a faster and more efficient way to get a response.  Please allow 48 business hours for a response.  Please remember that this is for non-urgent requests.  _______________________________________________________

## 2024-07-27 NOTE — Progress Notes (Signed)
 Agree with assessment and plan as outlined.

## 2024-07-29 ENCOUNTER — Ambulatory Visit: Payer: Self-pay | Admitting: Physician Assistant

## 2024-08-05 ENCOUNTER — Ambulatory Visit
Admission: RE | Admit: 2024-08-05 | Discharge: 2024-08-05 | Disposition: A | Discharge status: Home / Self Care | Source: Ambulatory Visit | Attending: Physician Assistant | Admitting: Physician Assistant

## 2024-08-05 DIAGNOSIS — G8929 Other chronic pain: Secondary | ICD-10-CM | POA: Diagnosis present

## 2024-08-05 DIAGNOSIS — R109 Unspecified abdominal pain: Secondary | ICD-10-CM | POA: Diagnosis present

## 2024-08-05 DIAGNOSIS — R10A2 Flank pain, left side: Secondary | ICD-10-CM | POA: Insufficient documentation

## 2024-08-05 DIAGNOSIS — K625 Hemorrhage of anus and rectum: Secondary | ICD-10-CM | POA: Insufficient documentation

## 2024-08-05 MED ORDER — IOHEXOL 300 MG/ML  SOLN
100.0000 mL | Freq: Once | INTRAMUSCULAR | Status: AC | PRN
  Administered 2024-08-05: 100 mL via INTRAVENOUS

## 2024-08-07 ENCOUNTER — Other Ambulatory Visit

## 2024-08-10 LAB — CALPROTECTIN, FECAL: Calprotectin, Fecal: 16 ug/g (ref 0–120)

## 2024-09-16 ENCOUNTER — Ambulatory Visit: Admitting: Physician Assistant

## 2024-10-11 ENCOUNTER — Ambulatory Visit: Admitting: Nurse Practitioner

## 2024-11-08 ENCOUNTER — Ambulatory Visit: Admitting: Nurse Practitioner
